# Patient Record
Sex: Female | Born: 1965
Health system: Southern US, Community
[De-identification: ages and names within clinical notes are randomized; demographics above are authoritative.]

## PROBLEM LIST (undated history)

## (undated) ENCOUNTER — Ambulatory Visit: Payer: 59 | Source: Home / Self Care

## (undated) DIAGNOSIS — I639 Cerebral infarction, unspecified: Secondary | ICD-10-CM

## (undated) DIAGNOSIS — Z789 Other specified health status: Secondary | ICD-10-CM

## (undated) DIAGNOSIS — I10 Essential (primary) hypertension: Secondary | ICD-10-CM

## (undated) HISTORY — DX: Other specified health status: Z78.9

## (undated) HISTORY — PX: ANKLE FRACTURE SURGERY: SHX122

---

## 1998-01-27 ENCOUNTER — Encounter: Admission: RE | Admit: 1998-01-27 | Discharge: 1998-01-27 | Payer: Self-pay | Admitting: Family Medicine

## 1998-02-01 ENCOUNTER — Other Ambulatory Visit: Admission: RE | Admit: 1998-02-01 | Discharge: 1998-02-01 | Payer: Self-pay | Admitting: *Deleted

## 1998-02-01 ENCOUNTER — Encounter: Admission: RE | Admit: 1998-02-01 | Discharge: 1998-02-01 | Payer: Self-pay | Admitting: Sports Medicine

## 1998-11-18 ENCOUNTER — Encounter: Admission: RE | Admit: 1998-11-18 | Discharge: 1998-11-18 | Payer: Self-pay | Admitting: Family Medicine

## 1999-11-02 ENCOUNTER — Encounter: Admission: RE | Admit: 1999-11-02 | Discharge: 1999-11-02 | Payer: Self-pay | Admitting: Family Medicine

## 1999-11-02 ENCOUNTER — Ambulatory Visit (HOSPITAL_COMMUNITY): Admission: RE | Admit: 1999-11-02 | Discharge: 1999-11-02 | Payer: Self-pay | Admitting: Family Medicine

## 1999-11-04 ENCOUNTER — Encounter: Payer: Self-pay | Admitting: Family Medicine

## 1999-11-04 ENCOUNTER — Encounter: Admission: RE | Admit: 1999-11-04 | Discharge: 1999-11-04 | Payer: Self-pay | Admitting: Family Medicine

## 1999-11-09 ENCOUNTER — Encounter: Admission: RE | Admit: 1999-11-09 | Discharge: 1999-11-09 | Payer: Self-pay | Admitting: Family Medicine

## 1999-11-09 ENCOUNTER — Encounter: Payer: Self-pay | Admitting: Family Medicine

## 2000-06-06 ENCOUNTER — Emergency Department (HOSPITAL_COMMUNITY): Admission: EM | Admit: 2000-06-06 | Discharge: 2000-06-06 | Payer: Self-pay | Admitting: Emergency Medicine

## 2004-06-22 ENCOUNTER — Emergency Department (HOSPITAL_COMMUNITY): Admission: EM | Admit: 2004-06-22 | Discharge: 2004-06-22 | Payer: Self-pay | Admitting: Emergency Medicine

## 2004-07-04 ENCOUNTER — Emergency Department (HOSPITAL_COMMUNITY): Admission: EM | Admit: 2004-07-04 | Discharge: 2004-07-04 | Payer: Self-pay | Admitting: Family Medicine

## 2004-07-07 ENCOUNTER — Emergency Department (HOSPITAL_COMMUNITY): Admission: EM | Admit: 2004-07-07 | Discharge: 2004-07-07 | Payer: Self-pay | Admitting: Family Medicine

## 2005-01-07 ENCOUNTER — Emergency Department (HOSPITAL_COMMUNITY): Admission: EM | Admit: 2005-01-07 | Discharge: 2005-01-08 | Payer: Self-pay | Admitting: Emergency Medicine

## 2005-01-07 IMAGING — CR DG ANKLE COMPLETE 3+V*L*
2 series · 2 of 2 positions shown · non-contrast
Comparison: none

CLINICAL DATA: Severe left ankle injury with pain and swelling.
 2-VIEW LEFT ANKLE:

[view not recorded (1 of 2)]
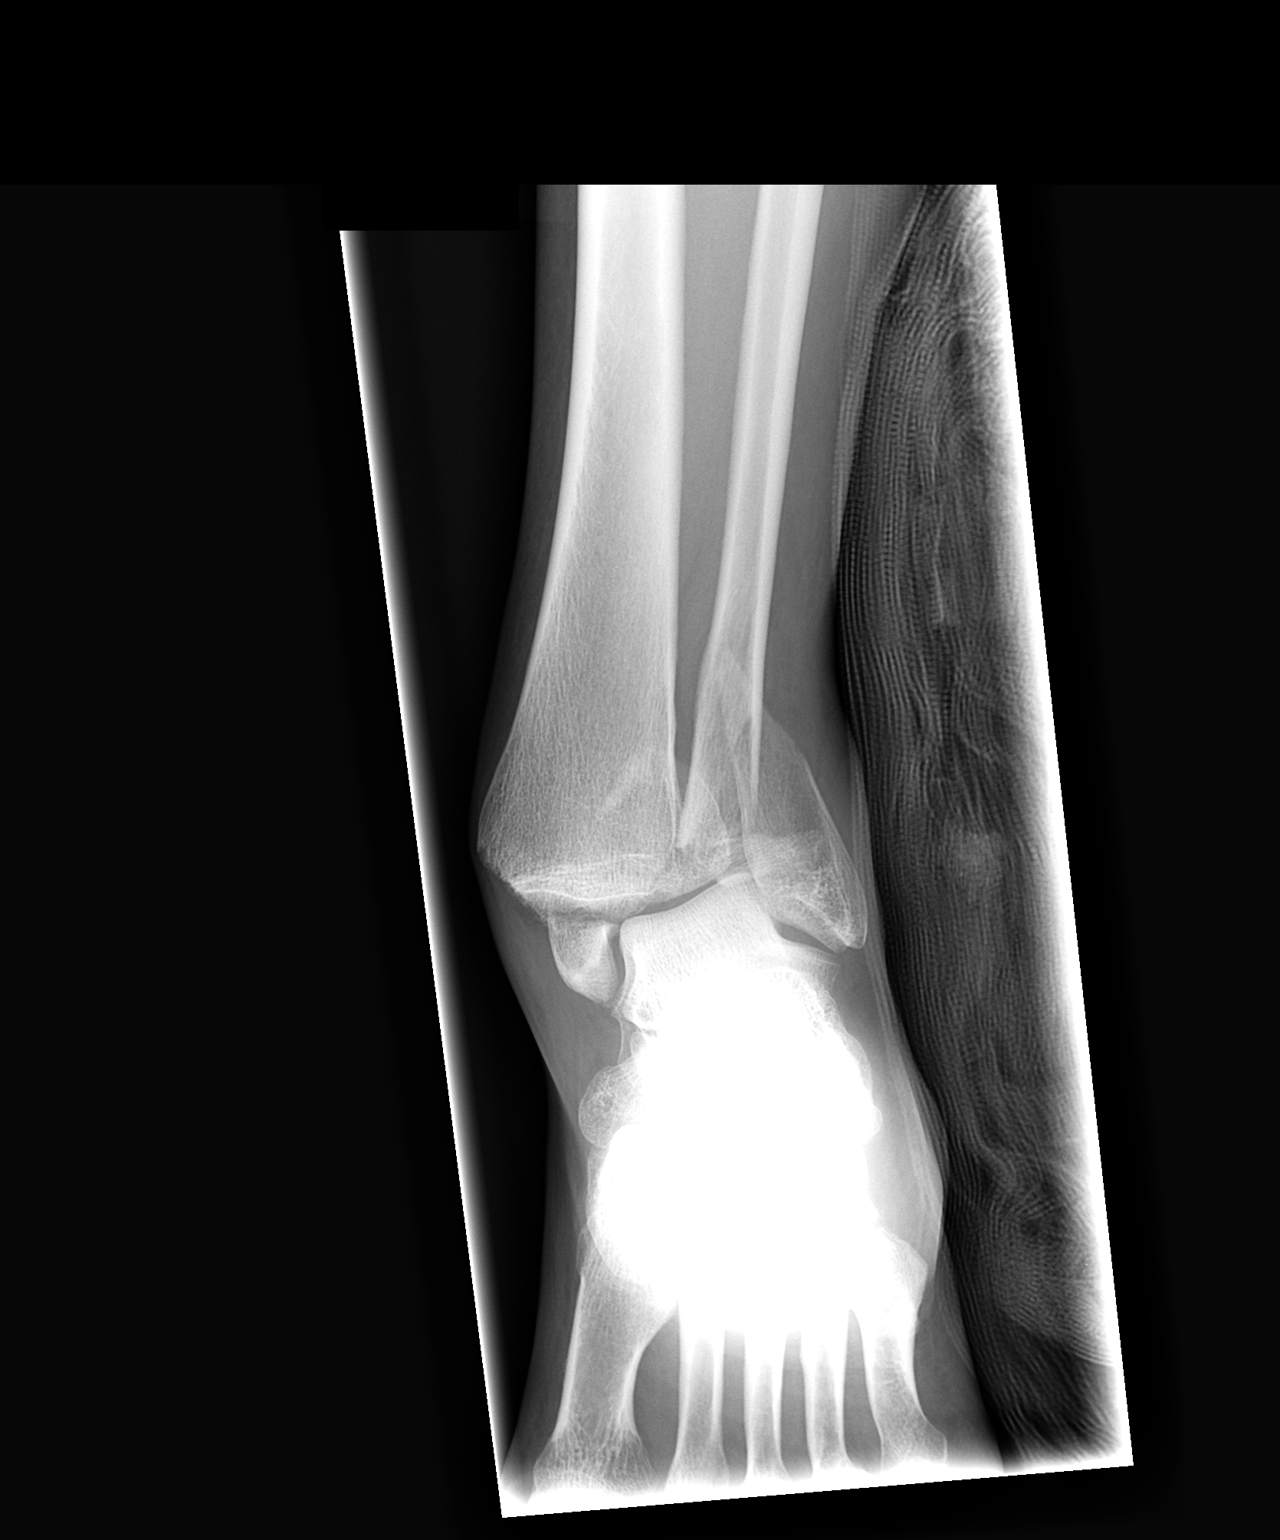

[view not recorded (2 of 2)]
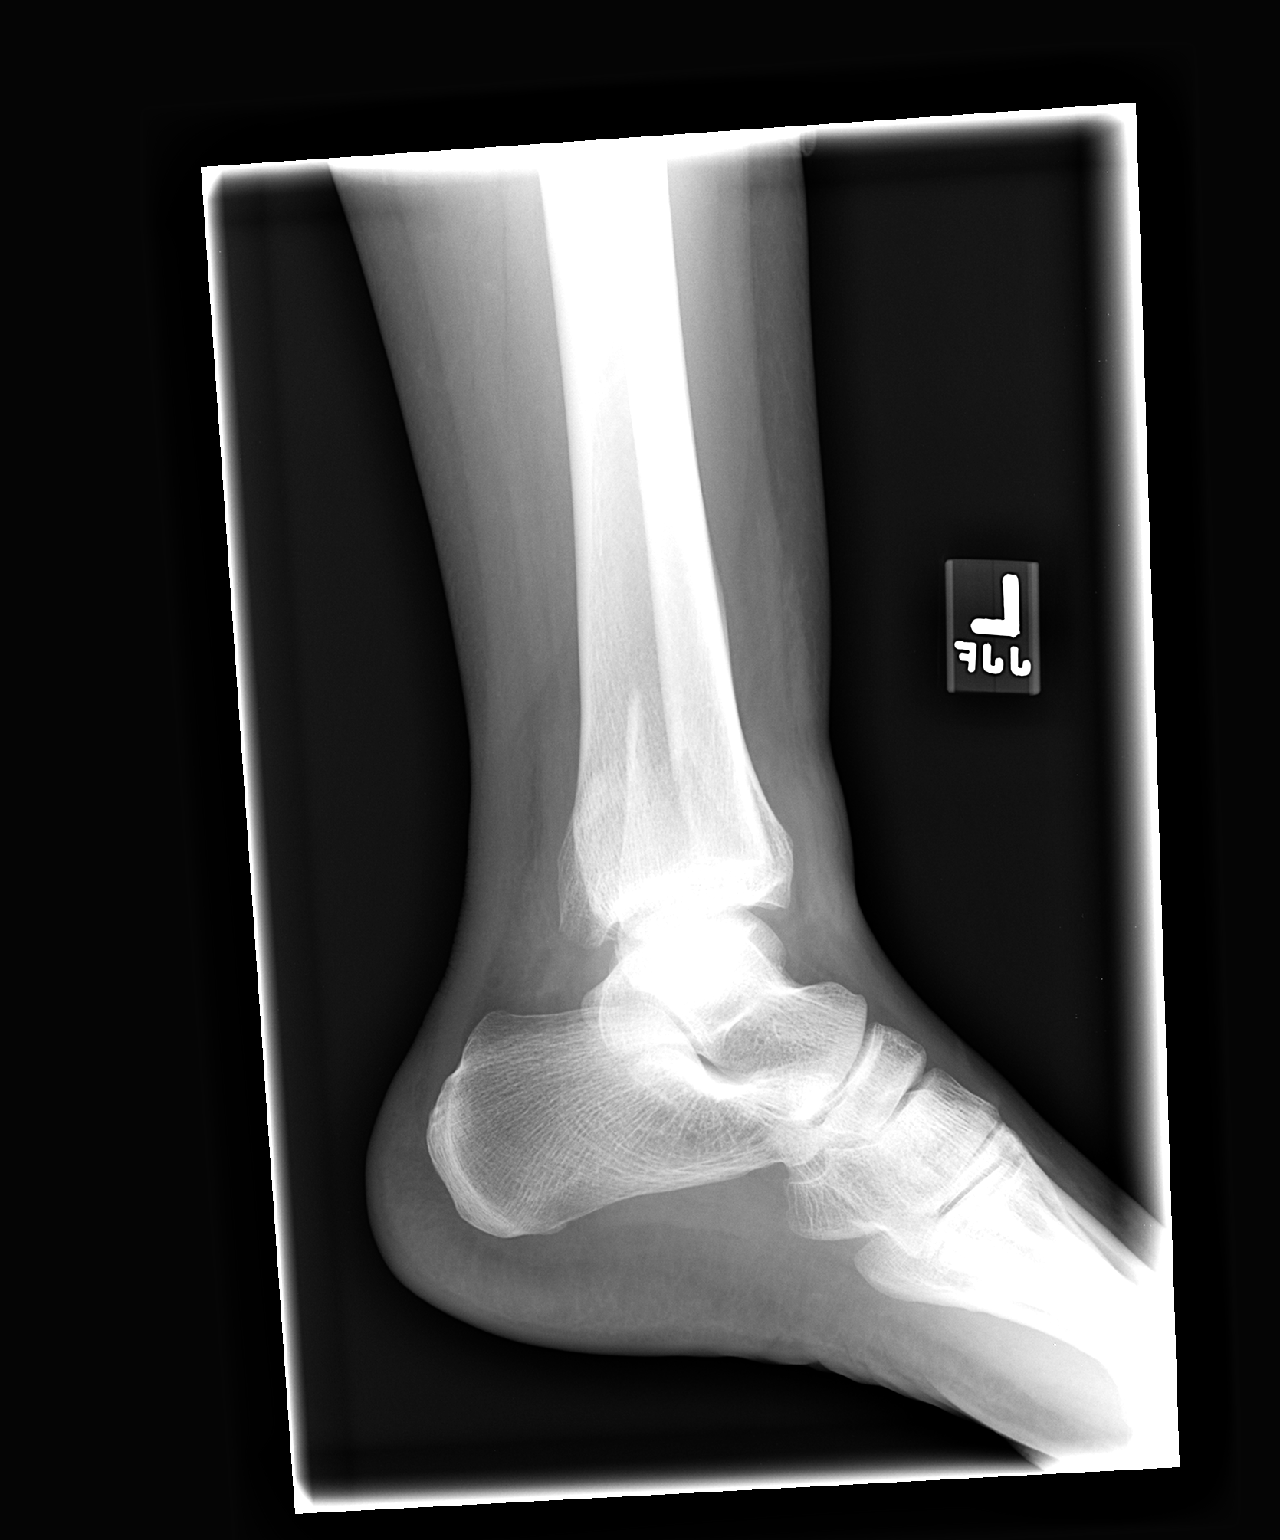

[2 of 2 positions shown; findings below may reference images not displayed]

FINDINGS: Trimalleolar fracture is identified with 1.3 cm lateral displacement.  There is widening of the anterior ankle mortise.
IMPRESSION: Trimalleolar fracture with 1.3 cm lateral displacement.

## 2005-01-09 ENCOUNTER — Ambulatory Visit (HOSPITAL_COMMUNITY): Admission: RE | Admit: 2005-01-09 | Discharge: 2005-01-10 | Payer: Self-pay | Admitting: Orthopedic Surgery

## 2005-03-06 ENCOUNTER — Emergency Department (HOSPITAL_COMMUNITY): Admission: EM | Admit: 2005-03-06 | Discharge: 2005-03-06 | Payer: Self-pay | Admitting: Family Medicine

## 2005-10-12 ENCOUNTER — Ambulatory Visit: Payer: Self-pay | Admitting: Family Medicine

## 2005-10-25 ENCOUNTER — Ambulatory Visit: Payer: Self-pay | Admitting: Sports Medicine

## 2007-07-07 ENCOUNTER — Emergency Department (HOSPITAL_COMMUNITY): Admission: EM | Admit: 2007-07-07 | Discharge: 2007-07-07 | Payer: Self-pay | Admitting: Family Medicine

## 2007-07-22 ENCOUNTER — Emergency Department (HOSPITAL_COMMUNITY): Admission: EM | Admit: 2007-07-22 | Discharge: 2007-07-22 | Payer: Self-pay | Admitting: Emergency Medicine

## 2007-08-02 ENCOUNTER — Emergency Department (HOSPITAL_COMMUNITY): Admission: EM | Admit: 2007-08-02 | Discharge: 2007-08-02 | Payer: Self-pay | Admitting: Family Medicine

## 2008-02-24 ENCOUNTER — Emergency Department (HOSPITAL_COMMUNITY): Admission: EM | Admit: 2008-02-24 | Discharge: 2008-02-24 | Payer: Self-pay | Admitting: Family Medicine

## 2009-08-01 ENCOUNTER — Emergency Department (HOSPITAL_COMMUNITY): Admission: EM | Admit: 2009-08-01 | Discharge: 2009-08-01 | Payer: Self-pay | Admitting: Emergency Medicine

## 2009-08-03 ENCOUNTER — Emergency Department (HOSPITAL_COMMUNITY): Admission: EM | Admit: 2009-08-03 | Discharge: 2009-08-03 | Payer: Self-pay | Admitting: Emergency Medicine

## 2009-08-10 ENCOUNTER — Emergency Department (HOSPITAL_COMMUNITY): Admission: EM | Admit: 2009-08-10 | Discharge: 2009-08-10 | Payer: Self-pay | Admitting: Emergency Medicine

## 2009-08-21 ENCOUNTER — Emergency Department (HOSPITAL_COMMUNITY): Admission: EM | Admit: 2009-08-21 | Discharge: 2009-08-21 | Payer: Self-pay | Admitting: Emergency Medicine

## 2010-05-30 LAB — POCT URINALYSIS DIP (DEVICE)
Bilirubin Urine: NEGATIVE
Glucose, UA: NEGATIVE mg/dL
Ketones, ur: NEGATIVE mg/dL
Nitrite: NEGATIVE
Protein, ur: 30 mg/dL — AB
Specific Gravity, Urine: 1.025 (ref 1.005–1.030)
Urobilinogen, UA: 0.2 mg/dL (ref 0.0–1.0)
pH: 6.5 (ref 5.0–8.0)

## 2010-05-30 LAB — HERPES SIMPLEX VIRUS CULTURE

## 2010-05-30 LAB — GC/CHLAMYDIA PROBE AMP, GENITAL
Chlamydia, DNA Probe: NEGATIVE
GC Probe Amp, Genital: NEGATIVE

## 2010-05-30 LAB — WET PREP, GENITAL: Yeast Wet Prep HPF POC: NONE SEEN

## 2010-05-30 LAB — POCT PREGNANCY, URINE: Preg Test, Ur: NEGATIVE

## 2010-05-30 LAB — HEMOCCULT GUIAC POC 1CARD (OFFICE): Fecal Occult Bld: NEGATIVE

## 2010-07-01 NOTE — Op Note (Signed)
[place], DOST          ACCOUNT NO.:  [id number]   MEDICAL RECORD NO.:  [id number]          PATIENT TYPE:  OIB   LOCATION:  5035                         FACILITY:  [facility]   PHYSICIAN:  Nadara Mustard, MD     DATE OF BIRTH:  Mar 15, 1965   DATE OF PROCEDURE:  01/09/2005  DATE OF DISCHARGE:  01/10/2005                                 OPERATIVE REPORT   PREOP DIAGNOSIS:  Fracture dislocation, bimalleolar left ankle fracture.   POSTOP DIAGNOSIS:  Fracture dislocation, bimalleolar left ankle fracture.   PROCEDURE:  Open reduction, internal fixation left ankle with fixation of  both the medial malleolus and lateral malleolus.   SURGEON:  Nadara Mustard, MD   ANESTHESIA:  General.   ESTIMATED BLOOD LOSS:  Minimal.   ANTIBIOTICS:  1 gram of Kefzol.   DRAINS:  None.   COMPLICATIONS:  None.   TOURNIQUET TIME:  None.   DISPOSITION:  To PACU in stable condition.   INDICATIONS FOR PROCEDURE:  The patient is a 45 year old woman who was  initially seen on Saturday in the emergency room. The patient had a  dislocated ankle fracture; and she was discharged to home from the emergency  room without orthopedic consultation. The patient arrived in my office,  today, on Monday with a persistent fracture dislocation. Examination of the  skin showed a fracture blister of the medial malleolus. The patient had a  persistent dislocation of the ankle. Due to the dislocation and fracture  pattern, the patient was brought urgently to the operating room for open  reduction internal fixation. The risks and benefits were discussed including  infection, neurovascular injury, nonhealing of the wounds, need for  additional surgery. The patient states that he understands and wishes to  proceed at this time.   DESCRIPTION OF PROCEDURE:  The patient was brought to OR room #4 and  underwent a general anesthetic. After adequate level of anesthesia obtained,  the patient's left lower extremity was  prepped using DuraPrep and draped  into a sterile field. A lateral incision was made over the fibula and this  was carried down to the fibula. The fracture site was freshened. The  anterior aspect of the distal fibula had flipped into the oblique fracture  site of the fibula. The three fragments were reduced; and the distal  fragment was lagged using a 3.5 cortical screw; and the oblique fracture was  also lagged using a 3.5 cortical screw. A neutralization plate was then  placed laterally with two screws distally and three screws proximally. The  wound was irrigated and C-arm fluoroscopy verified reduction and restoration  of the length of the fibula. The subcu was closed using 2-0 Vicryl. The skin  was closed using approximating staples.   Attention was then focused medially. The medial malleolar fragment was  reduced, irrigated, and stabilized with a 40 mm, long 207, 4.0 mm cancellus  screw. This stabilized the fracture well. The wounds were irrigated. C-arm  fluoroscopy verified reduction in both AP and lateral planes. The subcu was  closed using 2-0 Vicryl. The skin was closed using approximating staples.  The lateral  blister was not within the surgical incision but was anterior to  it. The wounds were covered with Adaptic, orthopedic sponges, sterile  Webril, and a Coban dressing. The patient was extubated, and taken to PACU  in stable condition.      Nadara Mustard, MD  Electronically Signed     MVD/MEDQ  D:  01/09/2005  T:  01/10/2005  Job:  [id number]

## 2011-08-30 ENCOUNTER — Encounter (HOSPITAL_COMMUNITY): Payer: Self-pay | Admitting: *Deleted

## 2011-08-30 ENCOUNTER — Emergency Department (HOSPITAL_COMMUNITY): Payer: Self-pay

## 2011-08-30 ENCOUNTER — Emergency Department (HOSPITAL_COMMUNITY)
Admission: EM | Admit: 2011-08-30 | Discharge: 2011-08-31 | Disposition: A | Discharge status: Home / Self Care | Payer: Self-pay | Attending: Emergency Medicine | Admitting: Emergency Medicine

## 2011-08-30 DIAGNOSIS — Y9301 Activity, walking, marching and hiking: Secondary | ICD-10-CM | POA: Insufficient documentation

## 2011-08-30 DIAGNOSIS — S93609A Unspecified sprain of unspecified foot, initial encounter: Secondary | ICD-10-CM

## 2011-08-30 DIAGNOSIS — X500XXA Overexertion from strenuous movement or load, initial encounter: Secondary | ICD-10-CM | POA: Insufficient documentation

## 2011-08-30 IMAGING — CR DG FOOT COMPLETE 3+V*L*
3 series · 3 of 3 positions shown · non-contrast
Comparison: [DATE]

CLINICAL DATA: Pain after twisting injury

LEFT FOOT - COMPLETE 3+ VIEW

[t foot ap left]
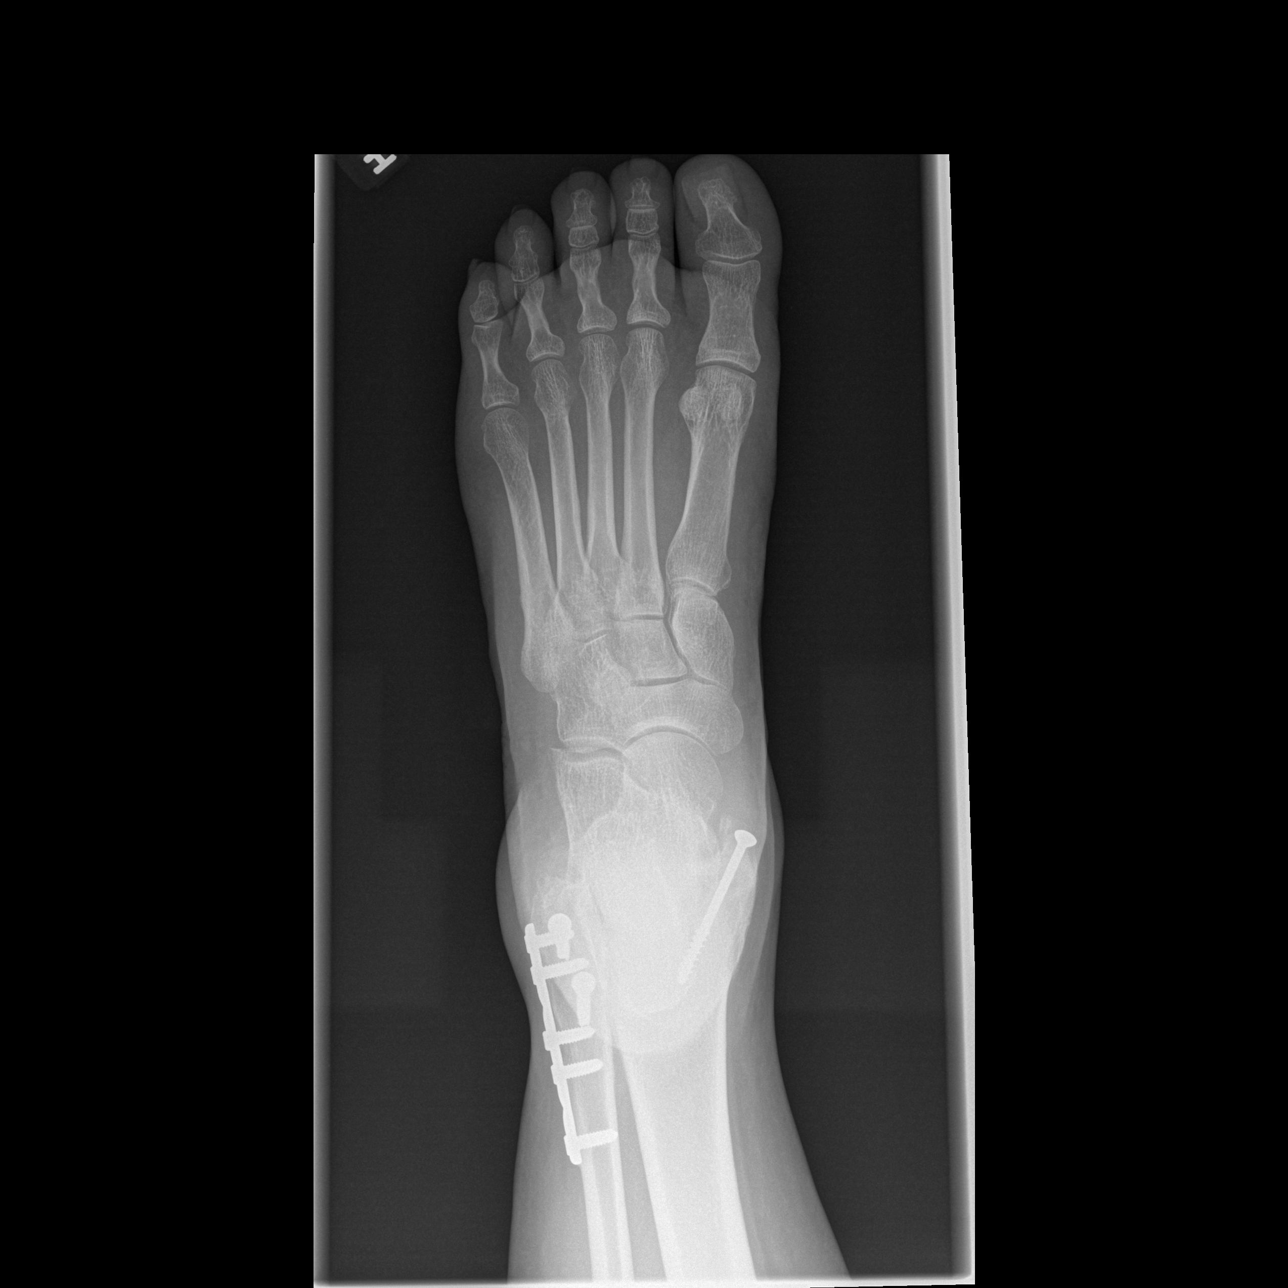

[t foot oblique left]
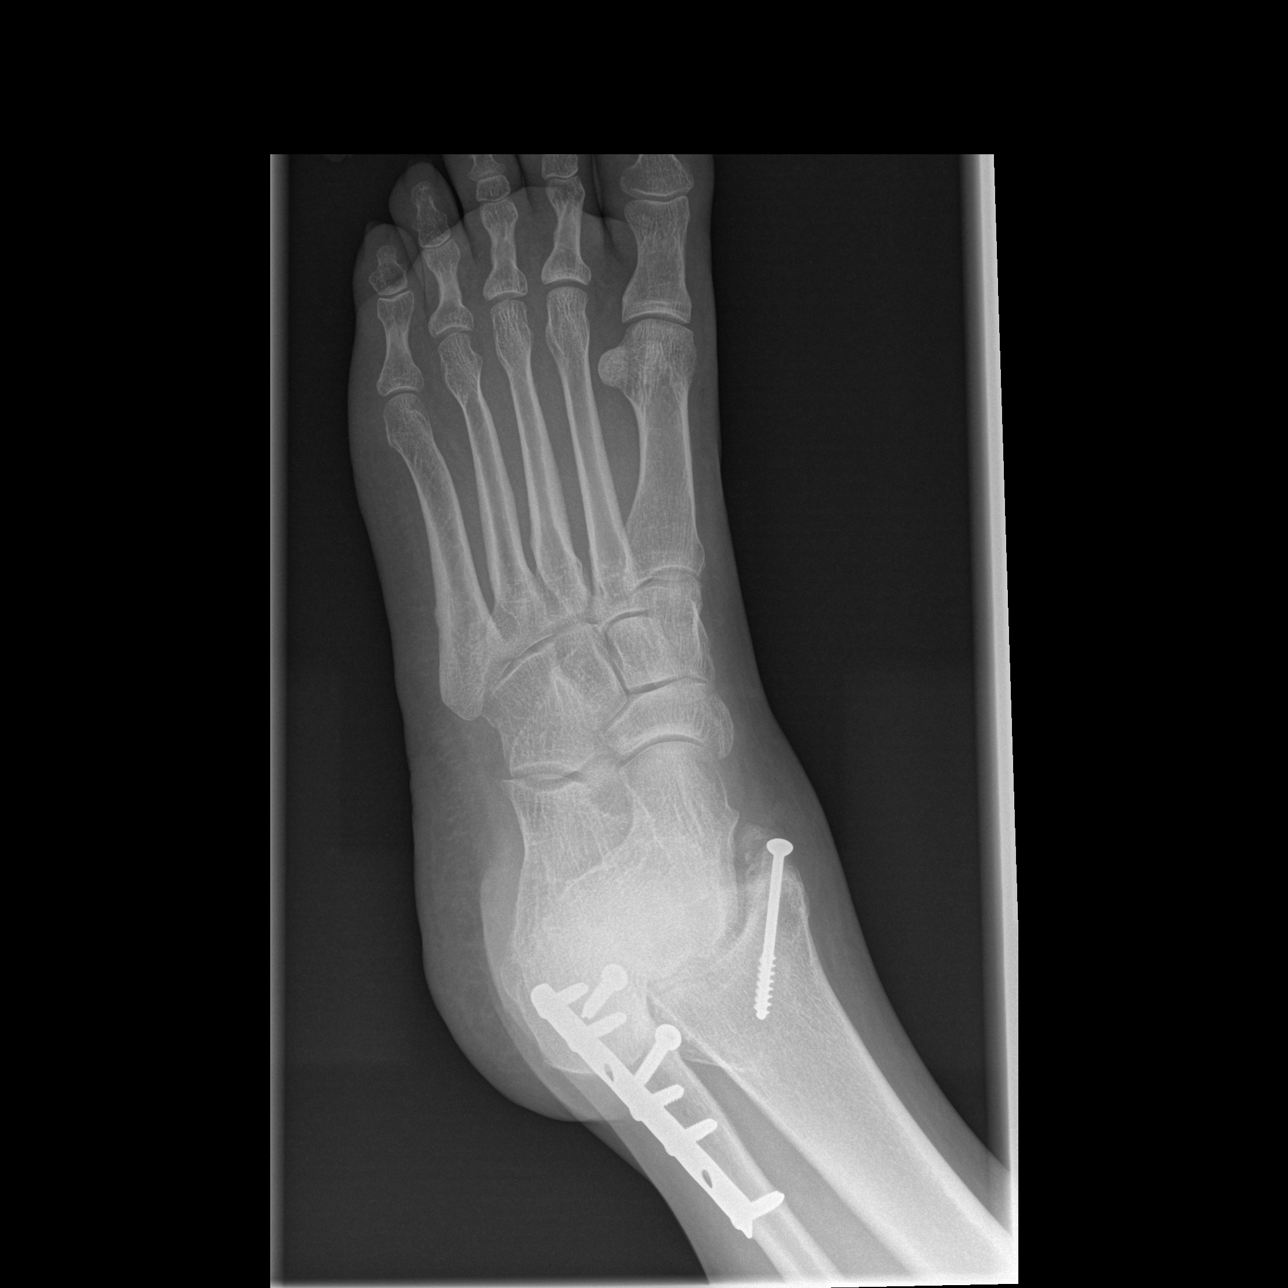

[t foot lat left]
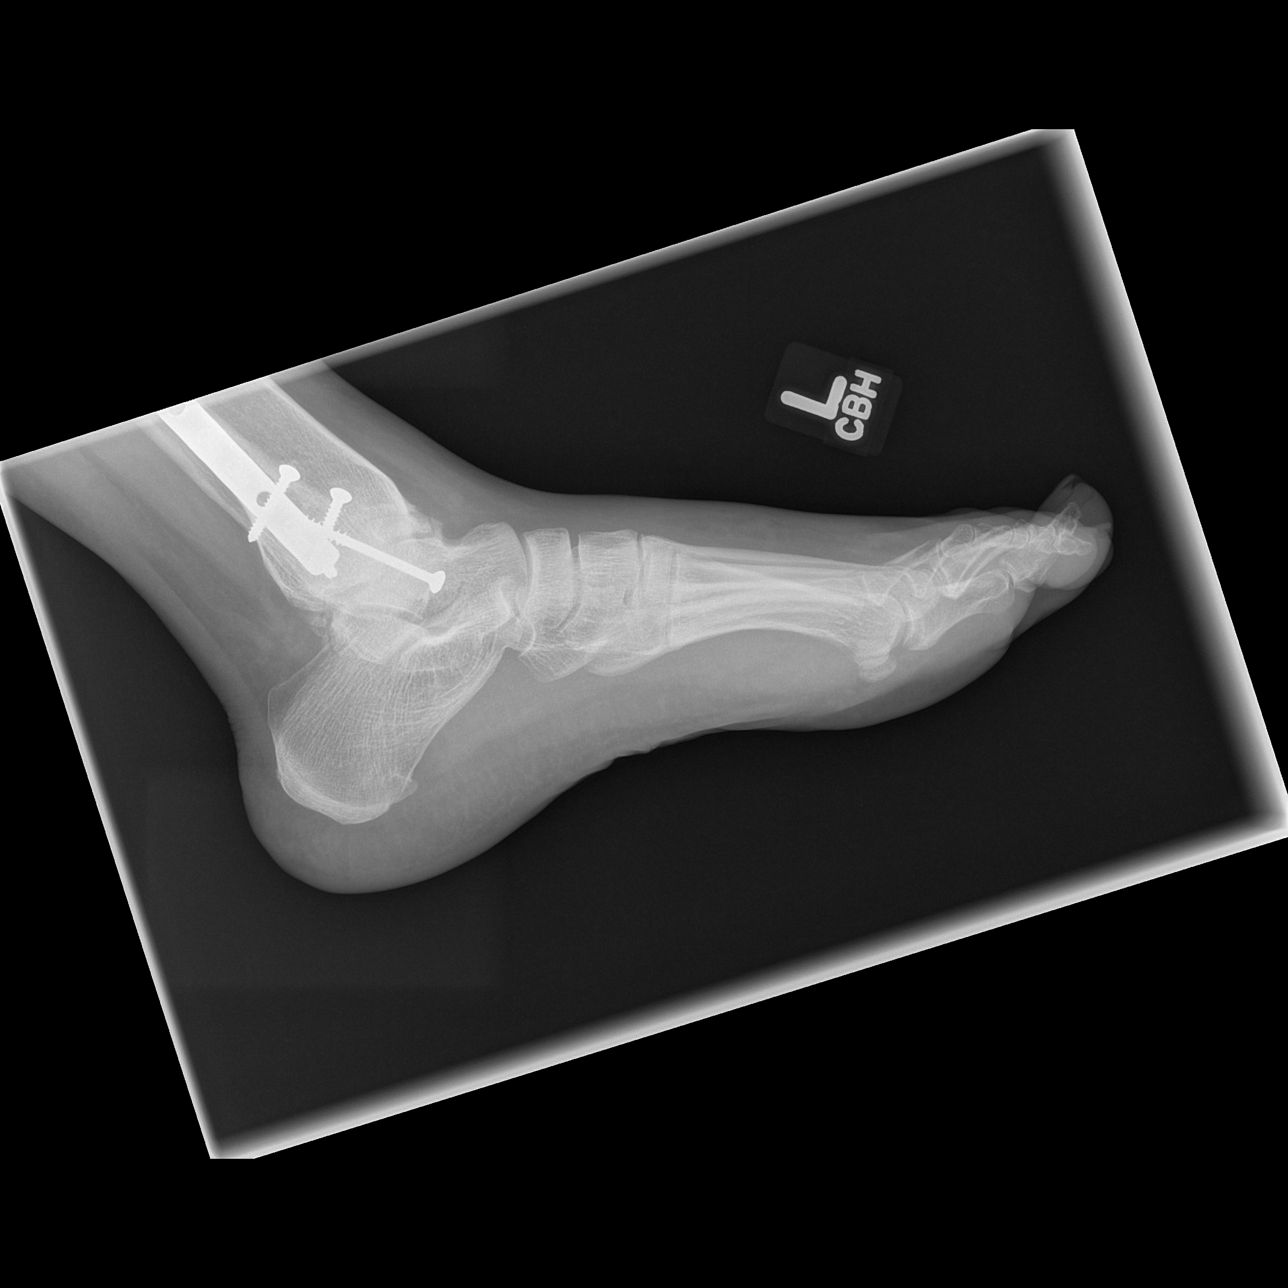

[3 of 3 positions shown; findings below may reference images not displayed]

FINDINGS: There has been interval plate and screw fixation of the
distal fibula and screw fixation of the medial malleolus, hardware
intact without surrounding lucency.  There are degenerative changes
at the tibiotalar articulation.  Negative for fracture or other
acute bony abnormality.  Normal mineralization and alignment.
IMPRESSION: 1.  Negative for fracture or other acute abnormality
2.  Postoperative and degenerative changes at the ankle.

## 2011-08-30 NOTE — ED Notes (Signed)
Patient twisted her foot about a week ago and it started hurting four days ago.  She noticed it swelling and the swelling is not going down.  Patient thinks that she heard a pop when she twisted her foot.  Patient is able to ambulate

## 2011-08-31 NOTE — ED Provider Notes (Signed)
History     CSN: [id number]  Arrival date & time 08/30/11  2118   None     Chief Complaint  Patient presents with  . Foot Swelling    (Consider location/radiation/quality/duration/timing/severity/associated sxs/prior treatment) HPI Comments: Patient state that a week ago.  She was walking and twisted her foot.  Initially, she had no pain, but a day or 2 later, she noticed some swelling in the midfoot area.  That has been persistent.  She does note, that if she elevates her foot and rested and wears a supportive shoe.  She has less discomfort, and swelling .  She, states she's been soaking her foot in Epson salts  The history is provided by the patient.    History reviewed. No pertinent past medical history.  History reviewed. No pertinent past surgical history.  No family history on file.  History  Substance Use Topics  . Smoking status: Never Smoker   . Smokeless tobacco: Not on file  . Alcohol Use: No    OB History    Grav Para Term Preterm Abortions TAB SAB Ect Mult Living                  Review of Systems  Constitutional: Negative for fever.  Musculoskeletal: Positive for gait problem. Negative for joint swelling.  Skin: Negative for wound.  Neurological: Negative for dizziness, weakness and numbness.    Allergies  Review of patient's allergies indicates no known allergies.  Home Medications  No current outpatient prescriptions on file.  BP 140/79  Pulse 100  Temp 98.1 F (36.7 C) (Oral)  Resp 16  SpO2 98%  LMP 08/07/2011  Physical Exam  Constitutional: She appears well-developed and well-nourished.  HENT:  Head: Normocephalic.  Eyes: Pupils are equal, round, and reactive to light.  Neck: Normal range of motion.  Cardiovascular: Normal rate.   Pulmonary/Chest: Effort normal.  Musculoskeletal: Normal range of motion.       Feet:  Neurological: She is alert.  Skin: Skin is warm. No erythema.    ED Course  Procedures (including critical  care time)  Labs Reviewed - No data to display Dg Foot Complete Left  08/30/2011  *RADIOLOGY REPORT*  Clinical Data: Pain after twisting injury  LEFT FOOT - COMPLETE 3+ VIEW  Comparison: 01/07/2005  Findings: There has been interval plate and screw fixation of the distal fibula and screw fixation of the medial malleolus, hardware intact without surrounding lucency.  There are degenerative changes at the tibiotalar articulation.  Negative for fracture or other acute bony abnormality.  Normal mineralization and alignment.  IMPRESSION:  1.  Negative for fracture or other acute abnormality 2.  Postoperative and degenerative changes at the ankle.  Original Report Authenticated By: Michelle Yang, M.D.     1. Foot sprain       MDM   Michelle Yang of x-ray reveals no fracture.  Patient's exam shows that she has some mild swelling at the base of the third, fourth, and fifth toe on her left foot.  It is mildly painful with deep palpation.  There is no pain with movement of the toes or ankle.  There is no erythema or breaks in the skin or discoloration.  Patient will be placed in an Ace bandage, and a postoperative boot with instructions to keep her foot elevated as much as possible over the next 3-4, days, with, regular doses of ibuprofen for pain and swelling        Cipriano Mile  Manus Rudd, NP 08/31/11 0018  Arman Filter, NP 08/31/11 [phone]

## 2011-09-01 NOTE — ED Provider Notes (Signed)
Medical screening examination/treatment/procedure(s) were performed by non-physician practitioner and as supervising physician I was immediately available for consultation/collaboration.   Zareen Jamison, MD 09/01/11 0724 

## 2012-08-17 ENCOUNTER — Encounter (HOSPITAL_COMMUNITY): Payer: Self-pay

## 2012-08-17 ENCOUNTER — Emergency Department (HOSPITAL_COMMUNITY)
Admission: EM | Admit: 2012-08-17 | Discharge: 2012-08-17 | Disposition: A | Discharge status: Home / Self Care | Payer: Self-pay | Attending: Emergency Medicine | Admitting: Emergency Medicine

## 2012-08-17 DIAGNOSIS — L237 Allergic contact dermatitis due to plants, except food: Secondary | ICD-10-CM

## 2012-08-17 DIAGNOSIS — L255 Unspecified contact dermatitis due to plants, except food: Secondary | ICD-10-CM | POA: Insufficient documentation

## 2012-08-17 DIAGNOSIS — R21 Rash and other nonspecific skin eruption: Secondary | ICD-10-CM | POA: Insufficient documentation

## 2012-08-17 MED ORDER — TRIAMCINOLONE ACETONIDE 0.5 % EX OINT: TOPICAL_OINTMENT | Freq: Two times a day (BID) | CUTANEOUS | Status: DC

## 2012-08-17 MED ORDER — HYDROXYZINE HCL 10 MG PO TABS
10.0000 mg | ORAL_TABLET | Freq: Once | ORAL | Status: AC
  Administered 2012-08-17: 10 mg via ORAL
  Filled 2012-08-17: qty 1

## 2012-08-17 MED ORDER — PREDNISONE 20 MG PO TABS: ORAL_TABLET | ORAL | Status: DC

## 2012-08-17 MED ORDER — HYDROXYZINE HCL 25 MG PO TABS: 25.0000 mg | ORAL_TABLET | Freq: Four times a day (QID) | ORAL | Status: DC

## 2012-08-17 NOTE — ED Provider Notes (Signed)
Medical screening examination/treatment/procedure(s) were performed by non-physician practitioner and as supervising physician I was immediately available for consultation/collaboration.   Kavish Lafitte, MD 08/17/12 2315 

## 2012-08-17 NOTE — ED Provider Notes (Signed)
   History    This chart was scribed for Arthor Captain, non-physician practitioner working with Dione Booze, MD by Leone Payor, ED Scribe. This patient was seen in room TR10C/TR10C and the patient's care was started at 1723.  CSN: [id number] Arrival date & time 08/17/12  1723  None    Chief Complaint  Patient presents with  . Poison Oak    The history is provided by the patient. No language interpreter was used.    HPI Comments: Michelle Yang is a 47 y.o. female who presents to the Emergency Department complaining of constant, gradually worsening poison oak starting 3 days ago. The rash is located to bilateral upper extremities, abdomen, and breasts. States she cut down a bush that she did not recognize as poison oak/ivy. She denies trying any treatments at home.   History reviewed. No pertinent past medical history. History reviewed. No pertinent past surgical history. History reviewed. No pertinent family history. History  Substance Use Topics  . Smoking status: Never Smoker   . Smokeless tobacco: Not on file  . Alcohol Use: No   OB History   Grav Para Term Preterm Abortions TAB SAB Ect Mult Living                 Review of Systems  Constitutional: Negative for fever and chills.  HENT: Negative for facial swelling.   Skin: Positive for rash.    Allergies  Review of patient's allergies indicates no known allergies.  Home Medications  No current outpatient prescriptions on file. There were no vitals taken for this visit. Physical Exam  Nursing note and vitals reviewed. Constitutional: She is oriented to person, place, and time. She appears well-developed and well-nourished. No distress.  HENT:  Head: Normocephalic and atraumatic.  Eyes: EOM are normal.  Neck: Neck supple. No tracheal deviation present.  Cardiovascular: Normal rate, regular rhythm and normal heart sounds.   Pulmonary/Chest: Effort normal and breath sounds normal. No respiratory distress. She  has no wheezes. She has no rales. She exhibits no tenderness.  Abdominal: Soft. There is no tenderness.  Musculoskeletal: Normal range of motion.  Neurological: She is alert and oriented to person, place, and time.  Skin: Skin is warm and dry. Rash noted. Rash is vesicular.  Vesicular eruption and serous weeping. Signs of excoriation along with linear vesicular patterns on bilateral arms. Multiple confluent vesicles. Areas are hot and red but no signs of infections. Also noted to breasts, abdomen and abdominal fold.   Psychiatric: She has a normal mood and affect. Her behavior is normal.    ED Course  Procedures (including critical care time)  DIAGNOSTIC STUDIES: Oxygen Saturation is 98% on RA, normal by my interpretation.    COORDINATION OF CARE: 5:39 PM Discussed treatment plan with pt at bedside and pt agreed to plan.   Labs Reviewed - No data to display No results found. 1. Poison ivy dermatitis     MDM  Patient with contact dermatitis. Sig confluent viesicles and weeping. I am discharging the patient with kenlog ointment. 21 day steroid taper and atarax. Domeboro soaks for comfort I personally performed the services described in this documentation, which was scribed in my presence. The recorded information has been reviewed and is accurate.    Arthor Captain, PA-C 08/17/12 1810

## 2012-08-17 NOTE — ED Notes (Signed)
Pt reports poison oak 3 days ago, denies at home treatment

## 2012-08-17 NOTE — ED Notes (Signed)
PA at bedside.

## 2014-01-28 ENCOUNTER — Encounter (HOSPITAL_COMMUNITY): Payer: Self-pay | Admitting: Emergency Medicine

## 2014-01-28 ENCOUNTER — Emergency Department (HOSPITAL_COMMUNITY)
Admission: EM | Admit: 2014-01-28 | Discharge: 2014-01-28 | Disposition: A | Discharge status: Home / Self Care | Payer: Self-pay | Attending: Emergency Medicine | Admitting: Emergency Medicine

## 2014-01-28 DIAGNOSIS — Z7952 Long term (current) use of systemic steroids: Secondary | ICD-10-CM | POA: Insufficient documentation

## 2014-01-28 DIAGNOSIS — R21 Rash and other nonspecific skin eruption: Secondary | ICD-10-CM | POA: Insufficient documentation

## 2014-01-28 DIAGNOSIS — Z79899 Other long term (current) drug therapy: Secondary | ICD-10-CM | POA: Insufficient documentation

## 2014-01-28 DIAGNOSIS — B09 Unspecified viral infection characterized by skin and mucous membrane lesions: Secondary | ICD-10-CM

## 2014-01-28 MED ORDER — OXYMETAZOLINE HCL 0.05 % NA SOLN
1.0000 | Freq: Two times a day (BID) | NASAL | Status: AC
  Administered 2014-01-28: 1 via NASAL
  Filled 2014-01-28: qty 15

## 2014-01-28 MED ORDER — DIPHENHYDRAMINE HCL 25 MG PO CAPS
50.0000 mg | ORAL_CAPSULE | Freq: Once | ORAL | Status: AC
  Administered 2014-01-28: 50 mg via ORAL
  Filled 2014-01-28: qty 2

## 2014-01-28 NOTE — Discharge Instructions (Signed)

## 2014-01-28 NOTE — ED Provider Notes (Signed)
CSN: [id number]     Arrival date & time 01/28/14  0719 History   First MD Initiated Contact with Patient 01/28/14 [phone]     Chief Complaint  Patient presents with  . Rash     (Consider location/radiation/quality/duration/timing/severity/associated sxs/prior Treatment) HPI Comments: Patient with no pertinent past medical history presents to the emergency department with chief complaint of hives. She states that she has had cold-like symptoms for the past 4 days. She states that yesterday she noticed a maculopapular rash. She states that the rash is been persistent until today. She has not tried taking anything to alleviate her symptoms. She denies any throat tightening, difficulty breathing, vomiting, or diarrhea. She denies any new exposures to new foods or chemical irritants.  The history is provided by the patient. No language interpreter was used.    History reviewed. No pertinent past medical history. Past Surgical History  Procedure Laterality Date  . Ankle fracture surgery Left    History reviewed. No pertinent family history. History  Substance Use Topics  . Smoking status: Never Smoker   . Smokeless tobacco: Not on file  . Alcohol Use: No   OB History    No data available     Review of Systems  Constitutional: Negative for fever and chills.  Respiratory: Negative for shortness of breath.   Cardiovascular: Negative for chest pain.  Gastrointestinal: Negative for nausea, vomiting, diarrhea and constipation.  Genitourinary: Negative for dysuria.  Skin: Positive for rash.  All other systems reviewed and are negative.     Allergies  Review of patient's allergies indicates no known allergies.  Home Medications   Prior to Admission medications   Medication Sig Start Date End Date Taking? Authorizing Provider  hydrOXYzine (ATARAX/VISTARIL) 25 MG tablet Take 1 tablet (25 mg total) by mouth every 6 (six) hours. 08/17/12   Arthor CaptainAbigail Harris, PA-C  predniSONE (DELTASONE) 20  MG tablet Take 3 tablets a day for the 1st 7 days Take 2 tablets a day for the next 7 days. Take 1 tablet a day for 3 days. Take 1/2 tablet daily for the last 4 days. 08/17/12   Arthor CaptainAbigail Harris, PA-C  triamcinolone ointment (KENALOG) 0.5 % Apply topically 2 (two) times daily. 08/17/12   Abigail Harris, PA-C   BP 143/86 mmHg  Pulse 104  Temp(Src) 98.3 F (36.8 C) (Oral)  Resp 16  Ht 5\' 4"  (1.626 m)  Wt 185 lb (83.915 kg)  BMI 31.74 kg/m2  SpO2 100%  LMP 12/14/2013 Physical Exam  Constitutional: She is oriented to person, place, and time. She appears well-developed and well-nourished.  HENT:  Head: Normocephalic and atraumatic.  Oropharynx is clear, airway intact, no oral swelling  Eyes: Conjunctivae and EOM are normal. Pupils are equal, round, and reactive to light.  Neck: Normal range of motion. Neck supple.  Cardiovascular: Normal rate and regular rhythm.  Exam reveals no gallop and no friction rub.   No murmur heard. Pulmonary/Chest: Effort normal and breath sounds normal. No respiratory distress. She has no wheezes. She has no rales. She exhibits no tenderness.  Clear to auscultation bilaterally  Abdominal: Soft. She exhibits no distension and no mass. There is no tenderness. There is no rebound and no guarding.  Musculoskeletal: Normal range of motion. She exhibits no edema or tenderness.  Neurological: She is alert and oriented to person, place, and time.  Skin: Skin is warm and dry.  Maculopapular rash to extremities and trunk  Psychiatric: She has a normal mood and affect. Her  behavior is normal. Judgment and thought content normal.  Nursing note and vitals reviewed.   ED Course  Procedures (including critical care time) Labs Review Labs Reviewed - No data to display  Imaging Review No results found.   EKG Interpretation None      MDM   Final diagnoses:  Viral rash    Patient with probable viral rash, no new contact exposures, patient is afebrile. Will treat  with Benadryl. No sign of anaphylaxis. Will reassess.  Patient feels improved.  Recommend continued benadryl.  Shared visit with Dr. Hyacinth MeekerMiller.    Roxy Horsemanobert Savvy Peeters, PA-C 01/28/14 [phone]  Vida RollerBrian D Miller, MD 01/29/14 [phone]

## 2014-01-28 NOTE — ED Provider Notes (Signed)
The patient is a 48 year old female, she has had upper respiratory symptoms including nasal congestion, stuffiness and a mild sore throat with postnasal drip for approximately 4 days, she has been using TheraFlu which she has used routinely in the past without symptoms, she presents because of an itching rash which started overnight, this is persistent, found on her body on both her legs, arms, face, neck, trunk. She denies any oral lesions or swelling, denies any fevers or chills, denies nausea or vomiting. On exam she has a fine papular rash over her extremities, this is pruritic but there is no signs of urticaria petechiae or purpura. Her nasal passages are clear, oropharynx is clear and moist, no tenderness over the sinuses, vital signs as below, suggest symptomatically treatment for what appears to be a viral illness, the papular rash is most likely an exanthem related to that infection, symptomatically treat with antihistamines, patient given warning and precautions on using antihistamines and sedation.  Benadryl, aspirin, home with antihistamine as well.  Filed Vitals:   01/28/14 0729  BP: 143/86  Pulse: 104  Temp: 98.3 F (36.8 C)  Resp: 16   Medical screening examination/treatment/procedure(s) were conducted as a shared visit with non-physician practitioner(s) and myself.  I personally evaluated the patient during the encounter.  Clinical Impression:   Final diagnoses:  Viral rash          Vida RollerBrian D Abubakar Crispo, MD 01/29/14 [phone]

## 2014-01-28 NOTE — ED Notes (Signed)
Pt reports generalized red, red, raised macular rash since yesterday, cold symptoms x 4 days.  Denies difficulty in breathing or fever

## 2014-01-29 ENCOUNTER — Emergency Department (HOSPITAL_COMMUNITY)
Admission: EM | Admit: 2014-01-29 | Discharge: 2014-01-29 | Disposition: A | Discharge status: Home / Self Care | Payer: Self-pay | Attending: Emergency Medicine | Admitting: Emergency Medicine

## 2014-01-29 ENCOUNTER — Encounter (HOSPITAL_COMMUNITY): Payer: Self-pay | Admitting: Emergency Medicine

## 2014-01-29 DIAGNOSIS — B09 Unspecified viral infection characterized by skin and mucous membrane lesions: Secondary | ICD-10-CM | POA: Insufficient documentation

## 2014-01-29 MED ORDER — HYDROXYZINE HCL 25 MG PO TABS: 25.0000 mg | ORAL_TABLET | Freq: Four times a day (QID) | ORAL | Status: DC

## 2014-01-29 MED ORDER — TRIAMCINOLONE 0.1 % CREAM:EUCERIN CREAM 1:1: 1.0000 "application " | TOPICAL_CREAM | Freq: Two times a day (BID) | CUTANEOUS | Status: DC

## 2014-01-29 MED ORDER — DEXAMETHASONE SODIUM PHOSPHATE 10 MG/ML IJ SOLN
10.0000 mg | Freq: Once | INTRAMUSCULAR | Status: AC
  Administered 2014-01-29: 10 mg via INTRAMUSCULAR
  Filled 2014-01-29: qty 1

## 2014-01-29 MED ORDER — PREDNISONE 20 MG PO TABS: ORAL_TABLET | ORAL | Status: DC

## 2014-01-29 MED ORDER — TRIAMCINOLONE ACETONIDE 0.5 % EX OINT
TOPICAL_OINTMENT | Freq: Two times a day (BID) | CUTANEOUS | Status: DC
Start: 2014-01-29 — End: 2018-02-14

## 2014-01-29 NOTE — ED Provider Notes (Signed)
CSN: [id number]     Arrival date & time 01/29/14  1630 History   This chart was scribed for Arthor CaptainAbigail Dillion Stowers, PA-C working withChristopher Nedra HaiJ. Pollina, * by Evon Slackerrance Branch, ED Scribe. This patient was seen in room TR10C/TR10C and the patient's care was started at 5:07 PM.     Chief Complaint  Patient presents with  . Rash   Patient is a 48 y.o. female presenting with rash. The history is provided by the patient. No language interpreter was used.  Rash Associated symptoms: no diarrhea, no fever, no nausea, no shortness of breath and not vomiting    HPI Comments: Michelle Yang is a 48 y.o. female who presents to the Emergency Department complaining of progressively worsening rash onset 2 days ago. Pt states that the rash continue to spread. She denies being in contact with anyone with similar rash. Pt states that the rash is itchy and burns. She states that she has had cold-like symptoms for the past 5 days. She has tried taking the benadryl prescribed yesterday to alleviate her symptoms that hasn't provided any releif. She denies any throat tightening, difficulty breathing, vomiting, or diarrhea. She denies any new exposures to new foods or chemical irritants.     History reviewed. No pertinent past medical history. Past Surgical History  Procedure Laterality Date  . Ankle fracture surgery Left    No family history on file. History  Substance Use Topics  . Smoking status: Never Smoker   . Smokeless tobacco: Not on file  . Alcohol Use: No   OB History    No data available     Review of Systems  Constitutional: Negative for fever and chills.  Respiratory: Negative for shortness of breath.   Cardiovascular: Negative for chest pain.  Gastrointestinal: Negative for nausea, vomiting and diarrhea.  Skin: Positive for rash.    Allergies  Review of patient's allergies indicates no known allergies.  Home Medications   Prior to Admission medications   Medication Sig Start Date  End Date Taking? Authorizing Provider  hydrOXYzine (ATARAX/VISTARIL) 25 MG tablet Take 1 tablet (25 mg total) by mouth every 6 (six) hours. Patient not taking: Reported on 01/28/2014 08/17/12   Arthor CaptainAbigail Othon Guardia, PA-C  Pheniramine-PE-APAP (THERAFLU FLU & SORE THROAT) 20-10-650 MG PACK Take 30 mLs by mouth every 8 (eight) hours as needed (flu).    Historical Provider, MD  predniSONE (DELTASONE) 20 MG tablet Take 3 tablets a day for the 1st 7 days Take 2 tablets a day for the next 7 days. Take 1 tablet a day for 3 days. Take 1/2 tablet daily for the last 4 days. Patient not taking: Reported on 01/28/2014 08/17/12   Arthor CaptainAbigail Benzion Mesta, PA-C  triamcinolone ointment (KENALOG) 0.5 % Apply topically 2 (two) times daily. Patient not taking: Reported on 01/28/2014 08/17/12   Arthor CaptainAbigail Elimelech Houseman, PA-C   Triage Vitals: BP 155/93 mmHg  Pulse 99  Temp(Src) 98 F (36.7 C) (Oral)  Resp 18  Ht 5\' 4"  (1.626 m)  Wt 189 lb (85.73 kg)  BMI 32.43 kg/m2  SpO2 99%  LMP 01/29/2014  Physical Exam  Constitutional: She is oriented to person, place, and time. She appears well-developed and well-nourished. No distress.  HENT:  Head: Normocephalic and atraumatic.  Eyes: Conjunctivae and EOM are normal.  Neck: Neck supple. No tracheal deviation present.  Cardiovascular: Normal rate.   Pulmonary/Chest: Effort normal. No respiratory distress.  Musculoskeletal: Normal range of motion.  Neurological: She is alert and oriented to person, place, and  time.  Skin: Skin is warm and dry. Rash noted. Rash is maculopapular.    coarse maculopapular rash located on extremities, chest, back and face, mild swelling, excoriation, and areas of confluence.  Psychiatric: She has a normal mood and affect. Her behavior is normal.  Nursing note and vitals reviewed.   ED Course  Procedures (including critical care time) DIAGNOSTIC STUDIES: Oxygen Saturation is 99% on RA, normal by my interpretation.    COORDINATION OF CARE: 5:17 PM-Discussed  treatment plan with pt at bedside and pt agreed to plan.     Labs Review Labs Reviewed - No data to display  Imaging Review No results found.   EKG Interpretation None      MDM   Final diagnoses:  Viral rash   Patient returns emergency department with worsening rash. Patient was treated yesterday with Benadryl alone. Today, she will get a shot of Decadron, 2 weeks of oral steroids and topical triamcinolone. The patient denies any contacts with similar symptoms and I highly doubt scabies as the root origin. However, I have advised the patient that if anyone else in her house seems to develop the same rash or rashes not going away with treatment, she should return for possible treatment of scabies.   I personally performed the services described in this documentation, which was scribed in my presence. The recorded information has been reviewed and is accurate.       Arthor Captainbigail Miliyah Luper, PA-C 01/29/14 1725  Gilda Creasehristopher J. Pollina, MD 01/29/14 [phone]

## 2014-01-29 NOTE — Discharge Instructions (Signed)
We are going to treat this rash with anititch medicine (atarax)[do not take benadryl and atarax] topical steroids and oral steroids. If the rash returns, worsens or does not go away, or anyone in your home develops the same rash, you may need treatment for scabies.    Viral Exanthems, Adult Many viral infections of the skin are called viral exanthems. Exanthem is another name for a rash or skin eruption. The most common viral exanthems include the following:  [place] measles or rubella.  Measles or rubeola.  Roseola.  Parvovirus B19 (Erythema infectiosum or Fifth disease).  Chickenpox or varicella. DIAGNOSIS  Sometimes, other problems may cause a rash that looks like a viral exanthem. Most often, your caregiver can determine whether you have a viral exanthem by looking at the rash. They usually have distinct patterns or appearance. Lab work may be done if the diagnosis is uncertain. Sometimes, a small tissue sample (biopsy) of the rash may need to be taken. TREATMENT  Immunizations have led to a decrease in the number of cases of measles, mumps, and rubella. Viral exanthems may require clinical treatment if a bacterial infection or other problems follow. The rash may be associated with:  Minor sore throat.  Aches and pains.  Runny nose.  Watery eyes.  Tiredness.  Some coughs.  Gastrointestinal infections causing nausea, vomiting, and diarrhea. Viral exanthems do not respond to antibiotic medicines, because they are not caused by bacteria. HOME CARE INSTRUCTIONS   Only take over-the-counter or prescription medicines for pain, discomfort, diarrhea, or fever as directed by your caregiver.  Drink enough water and fluids to keep your urine clear or pale yellow. SEEK MEDICAL CARE IF:  You develop swollen neck glands. This may feel like lumps or bumps in the neck.  You develop tenderness over your sinuses.  You are not feeling partly better after 3 days.  You develop muscle  aches.  You are feeling more tired than you would expect.  You get a persistent cough with mucus. SEEK IMMEDIATE MEDICAL CARE IF:   You have a fever.  You develop red eyes or eye pain.  You develop sores in your mouth and difficulty drinking or eating.  You develop a sore throat with pus and difficulty swallowing.  You develop neck pain or a stiff neck.  You develop a severe headache.  You develop vomiting that will not stop. Document Released: 04/22/2002 Document Revised: 04/24/2011 Document Reviewed: 04/19/2010 [facility] Patient Information 2015 [place], [place]. This information is not intended to replace advice given to you by your health care provider. Make sure you discuss any questions you have with your health care provider.

## 2014-01-29 NOTE — ED Notes (Signed)
Rash onset Tuesday, seen yesterday, returns for worsening rash, no oral swelling or resp distress, no vomiting, A/O X4, ambulatory and in NAD

## 2014-01-29 NOTE — ED Notes (Signed)
Pt comfortable with discharge and follow up instructions. Pt declines wheelchair, escorted to waiting area by this RN. Prescriptions x4. 

## 2014-02-02 ENCOUNTER — Emergency Department (HOSPITAL_COMMUNITY)
Admission: EM | Admit: 2014-02-02 | Discharge: 2014-02-02 | Disposition: A | Discharge status: Home / Self Care | Payer: Self-pay | Attending: Emergency Medicine | Admitting: Emergency Medicine

## 2014-02-02 ENCOUNTER — Encounter (HOSPITAL_COMMUNITY): Payer: Self-pay | Admitting: Family Medicine

## 2014-02-02 DIAGNOSIS — Z7952 Long term (current) use of systemic steroids: Secondary | ICD-10-CM | POA: Insufficient documentation

## 2014-02-02 DIAGNOSIS — L509 Urticaria, unspecified: Secondary | ICD-10-CM | POA: Insufficient documentation

## 2014-02-02 MED ORDER — DIPHENHYDRAMINE HCL 25 MG PO TABS: 25.0000 mg | ORAL_TABLET | Freq: Four times a day (QID) | ORAL | Status: DC

## 2014-02-02 MED ORDER — PREDNISONE 20 MG PO TABS
60.0000 mg | ORAL_TABLET | Freq: Once | ORAL | Status: DC
  Filled 2014-02-02: qty 3

## 2014-02-02 MED ORDER — FAMOTIDINE 20 MG PO TABS
20.0000 mg | ORAL_TABLET | Freq: Once | ORAL | Status: AC
  Administered 2014-02-02: 20 mg via ORAL
  Filled 2014-02-02: qty 1

## 2014-02-02 MED ORDER — DIPHENHYDRAMINE HCL 25 MG PO CAPS
50.0000 mg | ORAL_CAPSULE | Freq: Once | ORAL | Status: AC
  Administered 2014-02-02: 50 mg via ORAL
  Filled 2014-02-02: qty 2

## 2014-02-02 MED ORDER — FAMOTIDINE 20 MG PO TABS: 20.0000 mg | ORAL_TABLET | Freq: Two times a day (BID) | ORAL | Status: DC

## 2014-02-02 NOTE — ED Notes (Signed)
Per pt sts rash that started 2 hours ago at work. Pt here 1 week ago for the same. Rash on chest and neck.

## 2014-02-02 NOTE — Discharge Instructions (Signed)
°  Read the information below.  Use the prescribed medication as directed.  Please discuss all new medications with your pharmacist.  You may return to the Emergency Department at any time for worsening condition or any new symptoms that concern you.  If you develop worsening rash, any swelling or itching in your mouth or throat, or any difficulty swallowing or breathing, call 911 or return to the ER immediately for a recheck.   Hives Hives are itchy, red, swollen areas of the skin. They can vary in size and location on your body. Hives can come and go for hours or several days (acute hives) or for several weeks (chronic hives). Hives do not spread from person to person (noncontagious). They may get worse with scratching, exercise, and emotional stress. CAUSES   Allergic reaction to food, additives, or drugs.  Infections, including the common cold.  Illness, such as vasculitis, lupus, or thyroid disease.  Exposure to sunlight, heat, or cold.  Exercise.  Stress.  Contact with chemicals. SYMPTOMS   Red or white swollen patches on the skin. The patches may change size, shape, and location quickly and repeatedly.  Itching.  Swelling of the hands, feet, and face. This may occur if hives develop deeper in the skin. DIAGNOSIS  Your caregiver can usually tell what is wrong by performing a physical exam. Skin or blood tests may also be done to determine the cause of your hives. In some cases, the cause cannot be determined. TREATMENT  Mild cases usually get better with medicines such as antihistamines. Severe cases may require an emergency epinephrine injection. If the cause of your hives is known, treatment includes avoiding that trigger.  HOME CARE INSTRUCTIONS   Avoid causes that trigger your hives.  Take antihistamines as directed by your caregiver to reduce the severity of your hives. Non-sedating or low-sedating antihistamines are usually recommended. Do not drive while taking an  antihistamine.  Take any other medicines prescribed for itching as directed by your caregiver.  Wear loose-fitting clothing.  Keep all follow-up appointments as directed by your caregiver. SEEK MEDICAL CARE IF:   You have persistent or severe itching that is not relieved with medicine.  You have painful or swollen joints. SEEK IMMEDIATE MEDICAL CARE IF:   You have a fever.  Your tongue or lips are swollen.  You have trouble breathing or swallowing.  You feel tightness in the throat or chest.  You have abdominal pain. These problems may be the first sign of a life-threatening allergic reaction. Call your local emergency services (911 in [place] MAKE SURE YOU:   Understand these instructions.  Will watch your condition.  Will get help right away if you are not doing well or get worse. Document Released: 01/30/2005 Document Revised: 02/04/2013 Document Reviewed: 04/25/2011 [facility] Patient Information 2015 [place], [place]. This information is not intended to replace advice given to you by your health care provider. Make sure you discuss any questions you have with your health care provider.

## 2014-02-02 NOTE — ED Provider Notes (Signed)
CSN: [id number]     Arrival date & time 02/02/14  1511 History  This chart was scribed for non-physician practitioner working with Audree CamelScott T Goldston, MD by Elveria Risingimelie Horne, ED Scribe. This patient was seen in room TR11C/TR11C and the patient's care was started at 4:44 PM.   Chief Complaint  Patient presents with  . Rash   The history is provided by the patient. No language interpreter was used.   HPI Comments: Michelle Yang is a 48 y.o. female who presents to the Emergency Department complaining of sudden onset rash three hours ago while at work. Patient reports itching burning rash located to her chest, neck and breasts. Patient evaluated 12/16 and 12/17 for rash and associated cold symptoms - states that the previous rash was completely different and is resolving. Patient denies itching, swelling in her throat or trouble breathing. Denies dizziness, lightheadedness, nausea, vomiting.  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 Patient denies new dermatological products at home, new clothes, furniture, pets, consumption of new/strange food. Patient denies known allergies. Patient reports that she is on a several week steroid taper; she is now at 60mg .    History reviewed. No pertinent past medical history. Past Surgical History  Procedure Laterality Date  . Ankle fracture surgery Left    History reviewed. No pertinent family history. History  Substance Use Topics  . Smoking status: Never Smoker   . Smokeless tobacco: Not on file  . Alcohol Use: No   OB History    No data available     Review of Systems  Constitutional: Negative for fever and chills.  HENT: Negative for trouble swallowing.   Respiratory: Negative for shortness of breath.   Cardiovascular: Negative for chest pain.  Gastrointestinal: Negative for nausea, vomiting, abdominal pain and diarrhea.  Skin: Positive for rash.  Neurological: Negative for dizziness and  light-headedness.    Allergies  Review of patient's allergies indicates no known allergies.  Home Medications   Prior to Admission medications   Medication Sig Start Date End Date Taking? Authorizing Provider  hydrOXYzine (ATARAX/VISTARIL) 25 MG tablet Take 1 tablet (25 mg total) by mouth every 6 (six) hours. 01/29/14  Yes Abigail Harris, PA-C  Pheniramine-PE-APAP (THERAFLU FLU & SORE THROAT) 20-10-650 MG PACK Take 30 mLs by mouth every 8 (eight) hours as needed (flu).   Yes Historical Provider, MD  predniSONE (DELTASONE) 20 MG tablet Take 3 tablets a day for the 1st 7 days Take 2 tablets a day for the next 7 days. Take 1 tablet a day for 3 days. Take 1/2 tablet daily for the last 4 days. 01/29/14  Yes Arthor CaptainAbigail Harris, PA-C  Triamcinolone Acetonide (TRIAMCINOLONE 0.1 % CREAM : EUCERIN) CREA Apply 1 application topically 2 (two) times daily. Do not apply to face. 01/29/14  Yes Arthor CaptainAbigail Harris, PA-C  triamcinolone ointment (KENALOG) 0.5 % Apply topically 2 (two) times daily. 01/29/14  Yes Arthor CaptainAbigail Harris, PA-C   Triage Vitals: BP 157/99 mmHg  Pulse 89  Temp(Src) 97.5 F (36.4 C)  Resp 20  Wt 195 lb 3 oz (88.536 kg)  SpO2 97%  LMP 01/29/2014 Physical Exam  Constitutional: She appears well-developed and well-nourished. No distress.  HENT:  Head: Normocephalic and atraumatic.  Mouth/Throat: Uvula is midline and oropharynx is clear and moist. Mucous membranes are not dry. No uvula swelling. No oropharyngeal exudate, posterior oropharyngeal edema, posterior oropharyngeal erythema or tonsillar abscesses.  No facial or lip edema  Eyes: Conjunctivae are  normal.  Neck: Normal range of motion. Neck supple.  Cardiovascular: Normal rate and regular rhythm.   Pulmonary/Chest: Effort normal and breath sounds normal. No stridor. No respiratory distress. She has no wheezes. She has no rales.  Neurological: She is alert.  Skin: Rash noted. She is not diaphoretic.  Urticarial rash including lower  back, upper abdomen, bilateral chest and breast and anterior neck.  Psychiatric: She has a normal mood and affect. Her behavior is normal.  Nursing note and vitals reviewed.   ED Course  Procedures (including critical care time)  COORDINATION OF CARE: 4:44 PM- Discussed treatment plan with patient at bedside and patient agreed to plan.   6:22 PM- Patient re evaluated and she much improved.    Labs Review Labs Reviewed - No data to display  Imaging Review No results found.   EKG Interpretation None      MDM   Final diagnoses:  Urticaria    Afebrile, nontoxic patient with urticarial rash over neck and trunk.  No airway symptoms or concerns.  PO prednisone, benadryl, pepcid given and patient observed with great improvement.  Pt on long steroid taper currently.   D/C home with continued prednisone, benadryl, pepcid, allergy follow up.  Discussed result, findings, treatment, and follow up  with patient.  Pt given return precautions.  Pt verbalizes understanding and agrees with plan.       I personally performed the services described in this documentation, which was scribed in my presence. The recorded information has been reviewed and is accurate.    Trixie Dredgemily Tasha Diaz, PA-C 02/02/14 1845  Audree CamelScott T Goldston, MD 02/03/14 [phone]

## 2014-02-05 ENCOUNTER — Emergency Department (HOSPITAL_COMMUNITY)
Admission: EM | Admit: 2014-02-05 | Discharge: 2014-02-05 | Disposition: A | Discharge status: Home / Self Care | Payer: Self-pay | Attending: Emergency Medicine | Admitting: Emergency Medicine

## 2014-02-05 ENCOUNTER — Encounter (HOSPITAL_COMMUNITY): Payer: Self-pay | Admitting: Emergency Medicine

## 2014-02-05 DIAGNOSIS — R0981 Nasal congestion: Secondary | ICD-10-CM | POA: Insufficient documentation

## 2014-02-05 DIAGNOSIS — Z7952 Long term (current) use of systemic steroids: Secondary | ICD-10-CM | POA: Insufficient documentation

## 2014-02-05 DIAGNOSIS — Z79899 Other long term (current) drug therapy: Secondary | ICD-10-CM | POA: Insufficient documentation

## 2014-02-05 DIAGNOSIS — L509 Urticaria, unspecified: Secondary | ICD-10-CM | POA: Insufficient documentation

## 2014-02-05 MED ORDER — DIPHENHYDRAMINE HCL 25 MG PO CAPS
25.0000 mg | ORAL_CAPSULE | Freq: Once | ORAL | Status: AC
  Administered 2014-02-05: 25 mg via ORAL
  Filled 2014-02-05: qty 1

## 2014-02-05 MED ORDER — FAMOTIDINE 20 MG PO TABS
20.0000 mg | ORAL_TABLET | Freq: Once | ORAL | Status: AC
  Administered 2014-02-05: 20 mg via ORAL
  Filled 2014-02-05: qty 1

## 2014-02-05 NOTE — ED Notes (Signed)
Pt complains of rash the arms and legs. Pt has been seen here twice with same complaint.PT states she was given prednisone and benadryl and medications are not working. Pt states, "last night I could not get any sleep it was itching so bad."Pt states rash comes and go. Pt  Denies any different food.

## 2014-02-05 NOTE — Discharge Instructions (Signed)
Hives Hives are itchy, red, swollen areas of the skin. They can vary in size and location on your body. Hives can come and go for hours or several days (acute hives) or for several weeks (chronic hives). Hives do not spread from person to person (noncontagious). They may get worse with scratching, exercise, and emotional stress. CAUSES   Allergic reaction to food, additives, or drugs.  Infections, including the common cold.  Illness, such as vasculitis, lupus, or thyroid disease.  Exposure to sunlight, heat, or cold.  Exercise.  Stress.  Contact with chemicals. SYMPTOMS   Red or white swollen patches on the skin. The patches may change size, shape, and location quickly and repeatedly.  Itching.  Swelling of the hands, feet, and face. This may occur if hives develop deeper in the skin. DIAGNOSIS  Your caregiver can usually tell what is wrong by performing a physical exam. Skin or blood tests may also be done to determine the cause of your hives. In some cases, the cause cannot be determined. TREATMENT  Mild cases usually get better with medicines such as antihistamines. Severe cases may require an emergency epinephrine injection. If the cause of your hives is known, treatment includes avoiding that trigger.  HOME CARE INSTRUCTIONS   Avoid causes that trigger your hives.  Take antihistamines as directed by your caregiver to reduce the severity of your hives. Non-sedating or low-sedating antihistamines are usually recommended. Do not drive while taking an antihistamine.  Take any other medicines prescribed for itching as directed by your caregiver.  Wear loose-fitting clothing.  Keep all follow-up appointments as directed by your caregiver. SEEK MEDICAL CARE IF:   You have persistent or severe itching that is not relieved with medicine.  You have painful or swollen joints. SEEK IMMEDIATE MEDICAL CARE IF:   You have a fever.  Your tongue or lips are swollen.  You have  trouble breathing or swallowing.  You feel tightness in the throat or chest.  You have abdominal pain. These problems may be the first sign of a life-threatening allergic reaction. Call your local emergency services (911 in [place] MAKE SURE YOU:   Understand these instructions.  Will watch your condition.  Will get help right away if you are not doing well or get worse. Document Released: 01/30/2005 Document Revised: 02/04/2013 Document Reviewed: 04/25/2011 Exit[employer] Patient Information 2015 [employer]. This information is not intended to replace advice given to you by your health care provider. Make sure you discuss any questions you have with your health care provider.  

## 2014-02-05 NOTE — ED Provider Notes (Signed)
CSN: [id number]     Arrival date & time 02/05/14  [id number] History   First MD Initiated Contact with Patient 02/05/14 [phone]     Chief Complaint  Patient presents with  . Rash   Michelle Yang is a 48 y.o. female who presents the emergency department complaining of an intermittent rash for the past 8 days associated with itching. The patient was evaluated in emergency department on 12/16, 12/17 and 02/02/2014 and diagnosed with urticaria. Patient had improvement in the ED with prednisone, Benadryl and Pepcid. Patient reports that after her ED visit on 02/02/2014 her rash resolved but returned about 12 hours later. Patient reports her rash is in her bilateral inner thighs and inner arms. She reports that this rash is improved since her previous visit but she is still itching and having trouble sleeping. Patient reports she last drawn Pepcid around 1 AM this morning. Patient has attempted no other treatments today. Patient she last prednisone yesterday morning. The patient is currently on a month-long prednisone taper. When the patient was initially evaluated on 01/28/14 she had an upper respiratory infection. Patient only reports nasal congestion today. Patient reports rash is worse at night. Patient reports she has a dermatologist appointment on 02/16/2014. Patient denies fevers, chills, vaginal discharge, vaginal itching, cough, wheezing, shortness of breath, trouble swallowing, abdominal pain, nausea, vomiting or diarrhea. Patient denies changes to her detergents, lotions, soaps, or perfumes. Patient denies new animals in her home. The patient denies any contacts with similar symptoms.  (Consider location/radiation/quality/duration/timing/severity/associated sxs/prior Treatment) HPI  No past medical history on file. Past Surgical History  Procedure Laterality Date  . Ankle fracture surgery Left    No family history on file. History  Substance Use Topics  . Smoking status: Never Smoker   .  Smokeless tobacco: Not on file  . Alcohol Use: No   OB History    No data available     Review of Systems  Constitutional: Negative for fever and chills.  HENT: Positive for congestion. Negative for ear pain, facial swelling, nosebleeds, postnasal drip, rhinorrhea, sinus pressure, sneezing, sore throat and trouble swallowing.   Eyes: Negative for pain, itching and visual disturbance.  Respiratory: Negative for cough, chest tightness, shortness of breath and wheezing.   Cardiovascular: Negative for chest pain and palpitations.  Gastrointestinal: Negative for nausea, vomiting, abdominal pain and diarrhea.  Genitourinary: Negative for dysuria, hematuria, vaginal bleeding and vaginal discharge.  Musculoskeletal: Negative for myalgias, back pain and neck pain.  Skin: Positive for rash. Negative for wound.  Neurological: Negative for dizziness, weakness, light-headedness, numbness and headaches.  All other systems reviewed and are negative.     Allergies  Review of patient's allergies indicates no known allergies.  Home Medications   Prior to Admission medications   Medication Sig Start Date End Date Taking? Authorizing Provider  diphenhydrAMINE (BENADRYL) 25 MG tablet Take 1 tablet (25 mg total) by mouth every 6 (six) hours. X 3 days then PRN allergic reaction, rash 02/02/14  Yes Trixie DredgeEmily West, PA-C  famotidine (PEPCID) 20 MG tablet Take 1 tablet (20 mg total) by mouth 2 (two) times daily. X 3 days then PRN allergic reaction 02/02/14  Yes Trixie DredgeEmily West, PA-C  predniSONE (DELTASONE) 20 MG tablet Take 3 tablets a day for the 1st 7 days Take 2 tablets a day for the next 7 days. Take 1 tablet a day for 3 days. Take 1/2 tablet daily for the last 4 days. 01/29/14  Yes Arthor CaptainAbigail Harris, PA-C  Triamcinolone  Acetonide (TRIAMCINOLONE 0.1 % CREAM : EUCERIN) CREA Apply 1 application topically 2 (two) times daily. Do not apply to face. 01/29/14  Yes Arthor Captain, PA-C  hydrOXYzine (ATARAX/VISTARIL) 25  MG tablet Take 1 tablet (25 mg total) by mouth every 6 (six) hours. Patient not taking: Reported on 02/05/2014 01/29/14   Arthor Captain, PA-C  triamcinolone ointment (KENALOG) 0.5 % Apply topically 2 (two) times daily. Patient not taking: Reported on 02/05/2014 01/29/14   Arthor Captain, PA-C   BP 138/92 mmHg  Pulse 81  Temp(Src) 98.8 F (37.1 C) (Oral)  Resp 16  Ht 5\' 4"  (1.626 m)  Wt 195 lb (88.451 kg)  BMI 33.46 kg/m2  SpO2 100%  LMP 01/29/2014 Physical Exam  Constitutional: She appears well-developed and well-nourished. No distress.  HENT:  Head: Normocephalic and atraumatic.  Right Ear: External ear normal.  Left Ear: External ear normal.  Nose: Nose normal.  Mouth/Throat: Oropharynx is clear and moist. No oropharyngeal exudate.  Bilateral tympanic membranes are pearly-gray without erythema or loss of landmarks. No rashes noted on face. No facial swelling. Uvula is midline without edema. No tonsillar hypertrophy or exudates.  Eyes: Conjunctivae are normal. Pupils are equal, round, and reactive to light. Right eye exhibits no discharge. Left eye exhibits no discharge.  Neck: Neck supple.  Cardiovascular: Normal rate, regular rhythm, normal heart sounds and intact distal pulses.  Exam reveals no gallop and no friction rub.   No murmur heard. Pulmonary/Chest: Effort normal and breath sounds normal. No respiratory distress. She has no wheezes. She has no rales.  Abdominal: Soft. She exhibits no distension. There is no tenderness.  Musculoskeletal: She exhibits no edema.  Lymphadenopathy:    She has no cervical adenopathy.  Neurological: She is alert. Coordination normal.  Skin: Skin is warm and dry. Rash noted. She is not diaphoretic. No pallor.  Urticarial rash on medial aspect of bilateral upper arms and medial aspect of bilateral thighs. No other rash noted. No rash on her back, buttocks, chest, abdomen or hands.    Psychiatric: She has a normal mood and affect. Her behavior  is normal.  Nursing note and vitals reviewed.   ED Course  Procedures (including critical care time) Labs Review Labs Reviewed - No data to display  Imaging Review No results found.   EKG Interpretation None      Filed Vitals:   02/05/14 0858 02/05/14 0940 02/05/14 1043 02/05/14 1045  BP: 154/104 131/83 132/88 138/92  Pulse: 81 78 83 81  Temp:  98.8 F (37.1 C)    TempSrc:  Oral    Resp:  16    Height:      Weight:      SpO2: 100% 99% 100% 100%     MDM   Meds given in ED:  Medications  diphenhydrAMINE (BENADRYL) capsule 25 mg (25 mg Oral Given 02/05/14 0912)  famotidine (PEPCID) tablet 20 mg (20 mg Oral Given 02/05/14 0912)    Discharge Medication List as of 02/05/2014 11:16 AM      Final diagnoses:  Urticaria   Kanyon A Reitan is a 48 y.o. female who presents the emergency department complaining of an intermittent rash for the past 8 days associated with itching. The patient was evaluated in emergency department on 12/16, 12/17 and 02/02/2014 and diagnosed with urticaria. Patient is currently on a month-long prednisone taper. Patient reports her rash is improved however it has not completely resolved. Patient is afebrile and nontoxic-appearing. Patient has a urticarial rash on her  bilateral inner thighs and bilateral inner arms. This improved somewhat during her course in ED. Is also an improvement from her previous ED visits. Due to the improvement with benadryl and pepcid it is unlikely this is fungal.  Advised patient that she needed to continue taking Benadryl and Pepcid for her rash. Advised her to continue taking the prednisone taper. Patient has an appointment with the dermatologist on 02/16/2014. I advised her to keep this appointment. Advised patient return to the emergency department with new or worsening symptoms or new concerns. Advised her to return with any difficulty breathing, worsening rash or fevers. The patient verbalized understanding and  agreement with plan.  This patient was discussed with and evaluated by Dr. Micheline Mazeocherty who agrees with assessment and plan.     Lawana ChambersWilliam Duncan Cathern Tahir, PA-C 02/05/14 1258  Toy CookeyMegan Docherty, MD 02/05/14 2005

## 2016-10-07 ENCOUNTER — Emergency Department (HOSPITAL_COMMUNITY)
Admission: EM | Admit: 2016-10-07 | Discharge: 2016-10-07 | Disposition: A | Discharge status: Home / Self Care | Payer: Self-pay | Attending: Emergency Medicine | Admitting: Emergency Medicine

## 2016-10-07 ENCOUNTER — Encounter (HOSPITAL_COMMUNITY): Payer: Self-pay | Admitting: *Deleted

## 2016-10-07 DIAGNOSIS — Z79899 Other long term (current) drug therapy: Secondary | ICD-10-CM | POA: Insufficient documentation

## 2016-10-07 DIAGNOSIS — R21 Rash and other nonspecific skin eruption: Secondary | ICD-10-CM | POA: Insufficient documentation

## 2016-10-07 MED ORDER — HYDROXYZINE HCL 25 MG PO TABS: 25.0000 mg | ORAL_TABLET | Freq: Four times a day (QID) | ORAL | 0 refills | Status: DC | PRN

## 2016-10-07 MED ORDER — PREDNISONE 20 MG PO TABS: ORAL_TABLET | ORAL | 0 refills | Status: DC

## 2016-10-07 NOTE — ED Triage Notes (Signed)
Patient with rash to arms and back of neck. Patient states she is allergic to the sun. Patient reports she is usually treated with steriods.airway intact. No distress.

## 2016-10-07 NOTE — Discharge Instructions (Signed)
Take prednisone as prescribed. Take hydroxyzine as needed for itching. Follow-up with the dermatologist in one week if symptoms are not improving. Return to the emergency room if you develop persistent high fevers, vision changes, swelling of tongue, lips, or throat, difficulty breathing, or any new or worsening symptoms.

## 2016-10-07 NOTE — ED Provider Notes (Signed)
[facility]-EMERGENCY DEPT Provider Note   CSN: [id number] Arrival date & time: 10/07/16  1536     History   Chief Complaint Chief Complaint  Patient presents with  . Rash    HPI Michelle Yang is a 51 y.o. female resenting with 3 days rash.  Patient states that 3 days ago, she was shaving tiles that are put on the ceiling. She had some of the debris fall onto her arms and around her neck. She developed some itching of these areas, but it was minimal. Yesterday, the itching increased and the rash became more pronounced. So far nothing has made it better, and heat makes the rash worse. Patient states she has a history of rash due to sun exposure, and in general has very sensitive skin. Usually when she goes out in the sun, she gets minimal rash well-controlled with topical creams. Patient has been using 2.5% hydrocortisone cream on the rash without relief. She reports a new detergent started several days prior, but she thought this might be the source of her rash, has stopped using that detergent and rewash all her close in a brain she has used before without issues. She denies any new food, exposures, soaps, shampoos, or environments. She denies lip, tongue, or throat swelling. She denies chest pain or shortness of breath. No one else at home has a rash. She denies fevers or chills. Rash is not painful. She has never seen a dermatologist.  HPI  History reviewed. No pertinent past medical history.  There are no active problems to display for this patient.   Past Surgical History:  Procedure Laterality Date  . ANKLE FRACTURE SURGERY Left     OB History    No data available       Home Medications    Prior to Admission medications   Medication Sig Start Date End Date Taking? Authorizing Provider  diphenhydrAMINE (BENADRYL) 25 MG tablet Take 1 tablet (25 mg total) by mouth every 6 (six) hours. X 3 days then PRN allergic reaction, rash 02/02/14   West, Emily, PA-C  famotidine  (PEPCID) 20 MG tablet Take 1 tablet (20 mg total) by mouth 2 (two) times daily. X 3 days then PRN allergic reaction 02/02/14   Trixie Dredge, PA-C  hydrOXYzine (ATARAX/VISTARIL) 25 MG tablet Take 1 tablet (25 mg total) by mouth every 6 (six) hours as needed for itching. 10/07/16   Gilad Dugger, PA-C  predniSONE (DELTASONE) 20 MG tablet Take 3 tablets a day for the 1st 5 days Take 2 tablets a day for the next 5 days. Take 1 tablet a day for 3 days. Take 1/2 tablet daily for the last 4 days. 10/07/16   Latrease Kunde, PA-C  Triamcinolone Acetonide (TRIAMCINOLONE 0.1 % CREAM : EUCERIN) CREA Apply 1 application topically 2 (two) times daily. Do not apply to face. 01/29/14   Arthor Captain, PA-C  triamcinolone ointment (KENALOG) 0.5 % Apply topically 2 (two) times daily. Patient not taking: Reported on 02/05/2014 01/29/14   Arthor Captain, PA-C    Family History History reviewed. No pertinent family history.  Social History Social History  Substance Use Topics  . Smoking status: Never Smoker  . Smokeless tobacco: Not on file  . Alcohol use No     Allergies   Patient has no known allergies.   Review of Systems Review of Systems  Constitutional: Negative for chills and fever.  HENT: Negative for facial swelling and trouble swallowing.   Respiratory: Negative for cough, chest tightness and  shortness of breath.   Cardiovascular: Negative for chest pain.  Gastrointestinal: Negative for nausea and vomiting.  Skin: Positive for rash.     Physical Exam Updated Vital Signs BP (!) 160/100 (BP Location: Left Arm)   Pulse 81   Temp 98.3 F (36.8 C) (Oral)   Resp 17   SpO2 100%   Physical Exam  Constitutional: She is oriented to person, place, and time. She appears well-developed and well-nourished. No distress.  HENT:  Head: Normocephalic and atraumatic.  Mouth/Throat: Uvula is midline, oropharynx is clear and moist and mucous membranes are normal.  No obvious lip, tongue, or  throat swelling.  Eyes: EOM are normal.  Neck: Normal range of motion.  Cardiovascular: Normal rate, regular rhythm and intact distal pulses.   Pulmonary/Chest: Effort normal and breath sounds normal. No respiratory distress. She has no wheezes.  Patient without difficulty breathing  Abdominal: She exhibits no distension.  Musculoskeletal: Normal range of motion.  Neurological: She is alert and oriented to person, place, and time.  Skin: Skin is warm. Rash noted.  Macular rash with minimal erythema surrounding the base of the neck, and bilateral arms from wrist to shirt sleeve. No rash and lower extremities, trunk, or areas that are blocked by clothes. No discharge from the rash. Rash has not spread to the face. No rash on the palms of the hands or in the web space.  Psychiatric: She has a normal mood and affect.  Nursing note and vitals reviewed.    ED Treatments / Results  Labs (all labs ordered are listed, but only abnormal results are displayed) Labs Reviewed - No data to display  EKG  EKG Interpretation None       Radiology No results found.  Procedures Procedures (including critical care time)  Medications Ordered in ED Medications - No data to display   Initial Impression / Assessment and Plan / ED Course  I have reviewed the triage vital signs and the nursing notes.  Pertinent labs & imaging results that were available during my care of the patient were reviewed by me and considered in my medical decision making (see chart for details).     Patient presenting with rash for the past 3 days, worsened recently. She's been trying topical cortisone cream without relief. History of rashes. Physical exam shows rash of areas exposed including the neck and bilateral arms. Appears consistent with contact dermatitis. Doubt SJS, TEN, or RMSF. No fevers or chills. No airway compromise or respiratory distress. Will treat with prednisone and Vistaril, both of which patient has  tolerated well in the past. Patient does not have a history of diabetes. At this time, patient appears safe discharge. Will provide contact information for dermatology. Return precautions given. Patient states she understands and agrees to plan.  Final Clinical Impressions(s) / ED Diagnoses   Final diagnoses:  Rash and nonspecific skin eruption    New Prescriptions Discharge Medication List as of 10/07/2016  6:56 PM       Alveria Apley, PA-C 10/07/16 Eloise Harman, MD 10/17/16 [phone]

## 2016-10-07 NOTE — ED Notes (Signed)
Declined W/C at D/C and was escorted to lobby by RN. 

## 2017-01-17 ENCOUNTER — Encounter (HOSPITAL_COMMUNITY): Payer: Self-pay

## 2017-01-17 ENCOUNTER — Emergency Department (HOSPITAL_COMMUNITY): Payer: Self-pay

## 2017-01-17 DIAGNOSIS — B349 Viral infection, unspecified: Secondary | ICD-10-CM | POA: Insufficient documentation

## 2017-01-17 DIAGNOSIS — K219 Gastro-esophageal reflux disease without esophagitis: Secondary | ICD-10-CM | POA: Insufficient documentation

## 2017-01-17 LAB — BASIC METABOLIC PANEL
Anion gap: 11 (ref 5–15)
BUN: 5 mg/dL — ABNORMAL LOW (ref 6–20)
CALCIUM: 9.2 mg/dL (ref 8.9–10.3)
CO2: 26 mmol/L (ref 22–32)
CREATININE: 0.72 mg/dL (ref 0.44–1.00)
Chloride: 103 mmol/L (ref 101–111)
Glucose, Bld: 125 mg/dL — ABNORMAL HIGH (ref 65–99)
Potassium: 3.4 mmol/L — ABNORMAL LOW (ref 3.5–5.1)
Sodium: 140 mmol/L (ref 135–145)

## 2017-01-17 LAB — CBC
HCT: 43.3 % (ref 36.0–46.0)
Hemoglobin: 14 g/dL (ref 12.0–15.0)
MCH: 27.5 pg (ref 26.0–34.0)
MCHC: 32.3 g/dL (ref 30.0–36.0)
MCV: 84.9 fL (ref 78.0–100.0)
PLATELETS: 281 10*3/uL (ref 150–400)
RBC: 5.1 MIL/uL (ref 3.87–5.11)
RDW: 14.3 % (ref 11.5–15.5)
WBC: 6.5 10*3/uL (ref 4.0–10.5)

## 2017-01-17 LAB — I-STAT TROPONIN, ED: TROPONIN I, POC: 0 ng/mL (ref 0.00–0.08)

## 2017-01-17 LAB — I-STAT BETA HCG BLOOD, ED (MC, WL, AP ONLY): I-stat hCG, quantitative: <5 m[IU]/mL (ref <5)

## 2017-01-17 IMAGING — DX DG CHEST 2V
2 series · 2 of 2 positions shown · non-contrast
Comparison: [DATE] report

CLINICAL DATA: 51-year-old female with mid chest pain and
productive cough x4 days with dyspnea.

EXAM:
CHEST  2 VIEW

[chest pa]
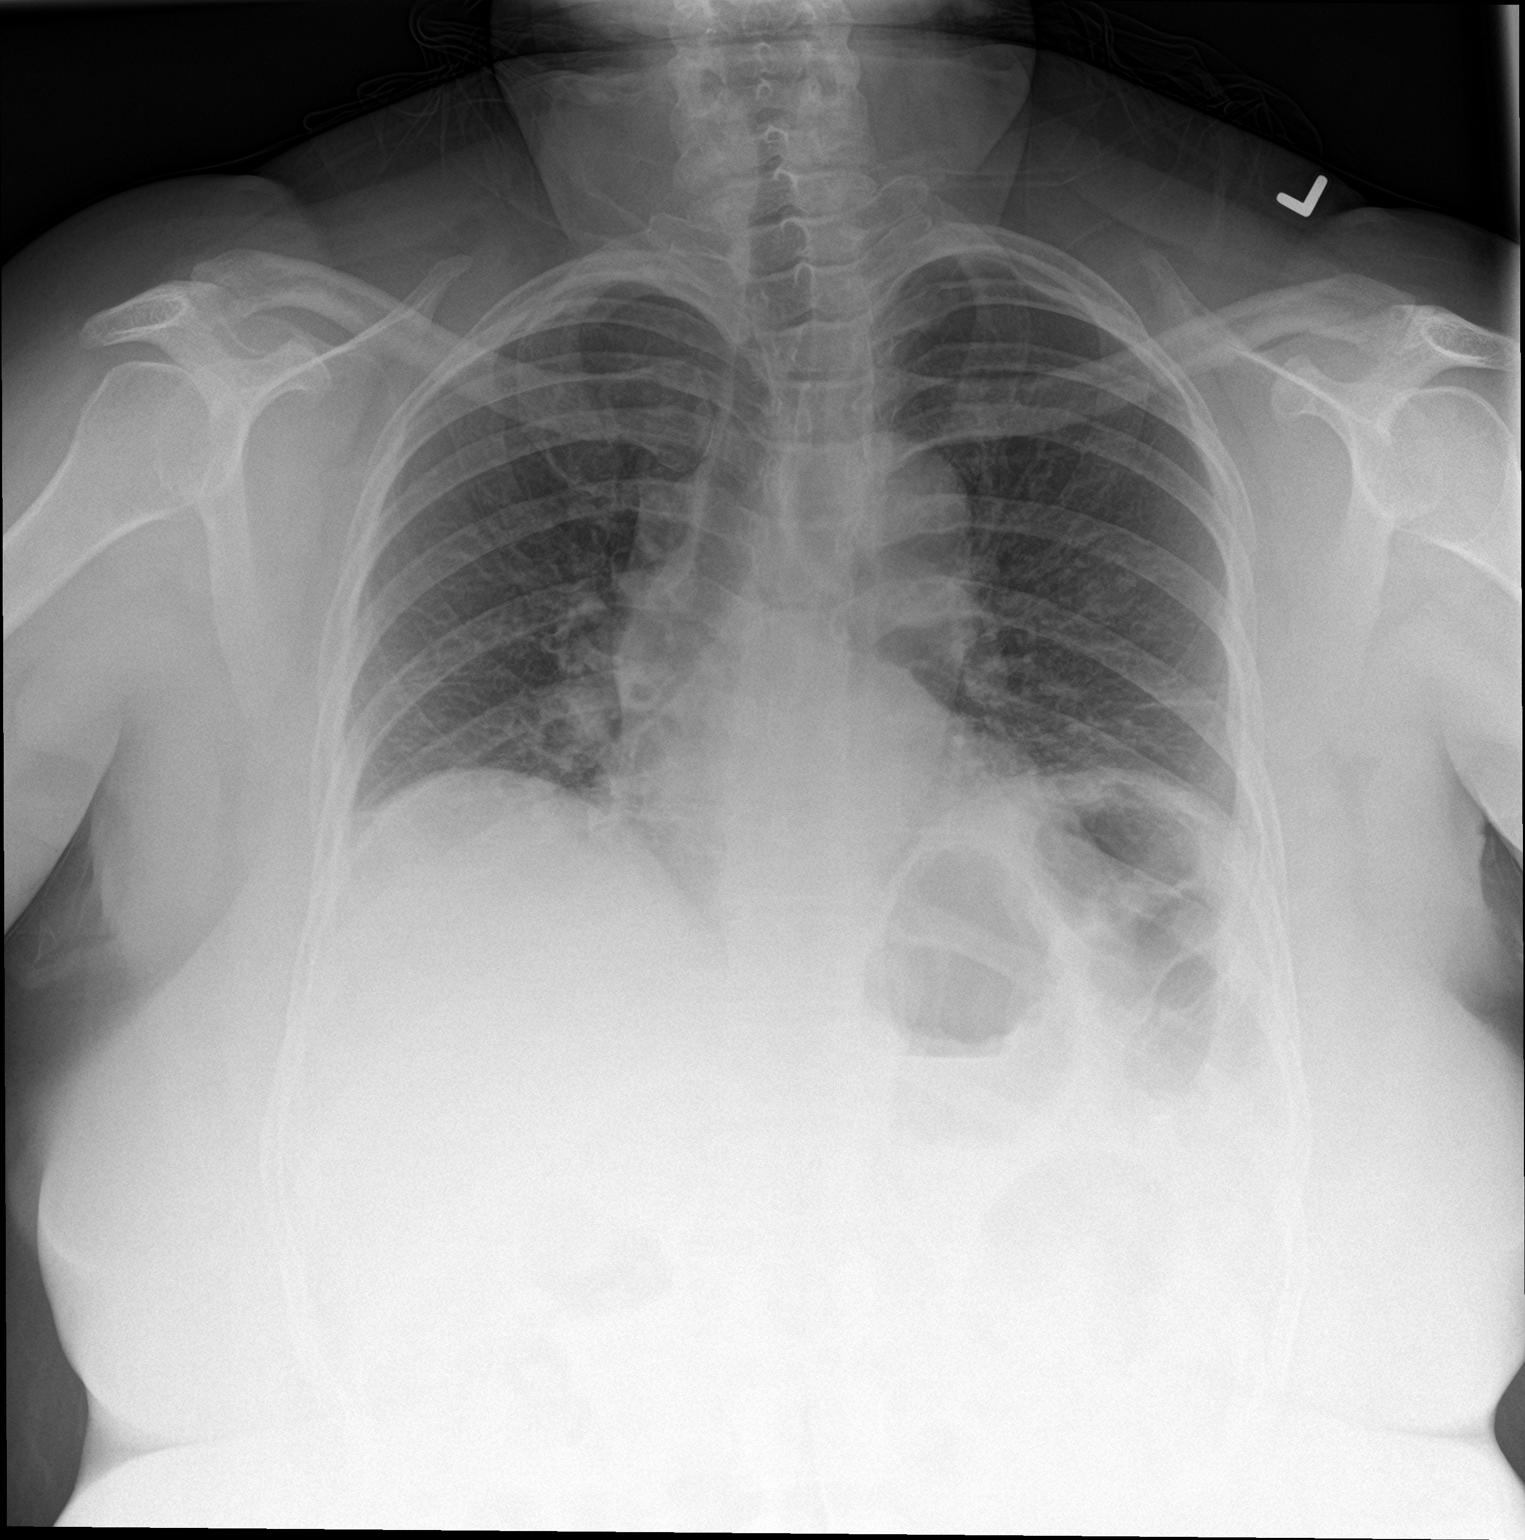

[chest lat]
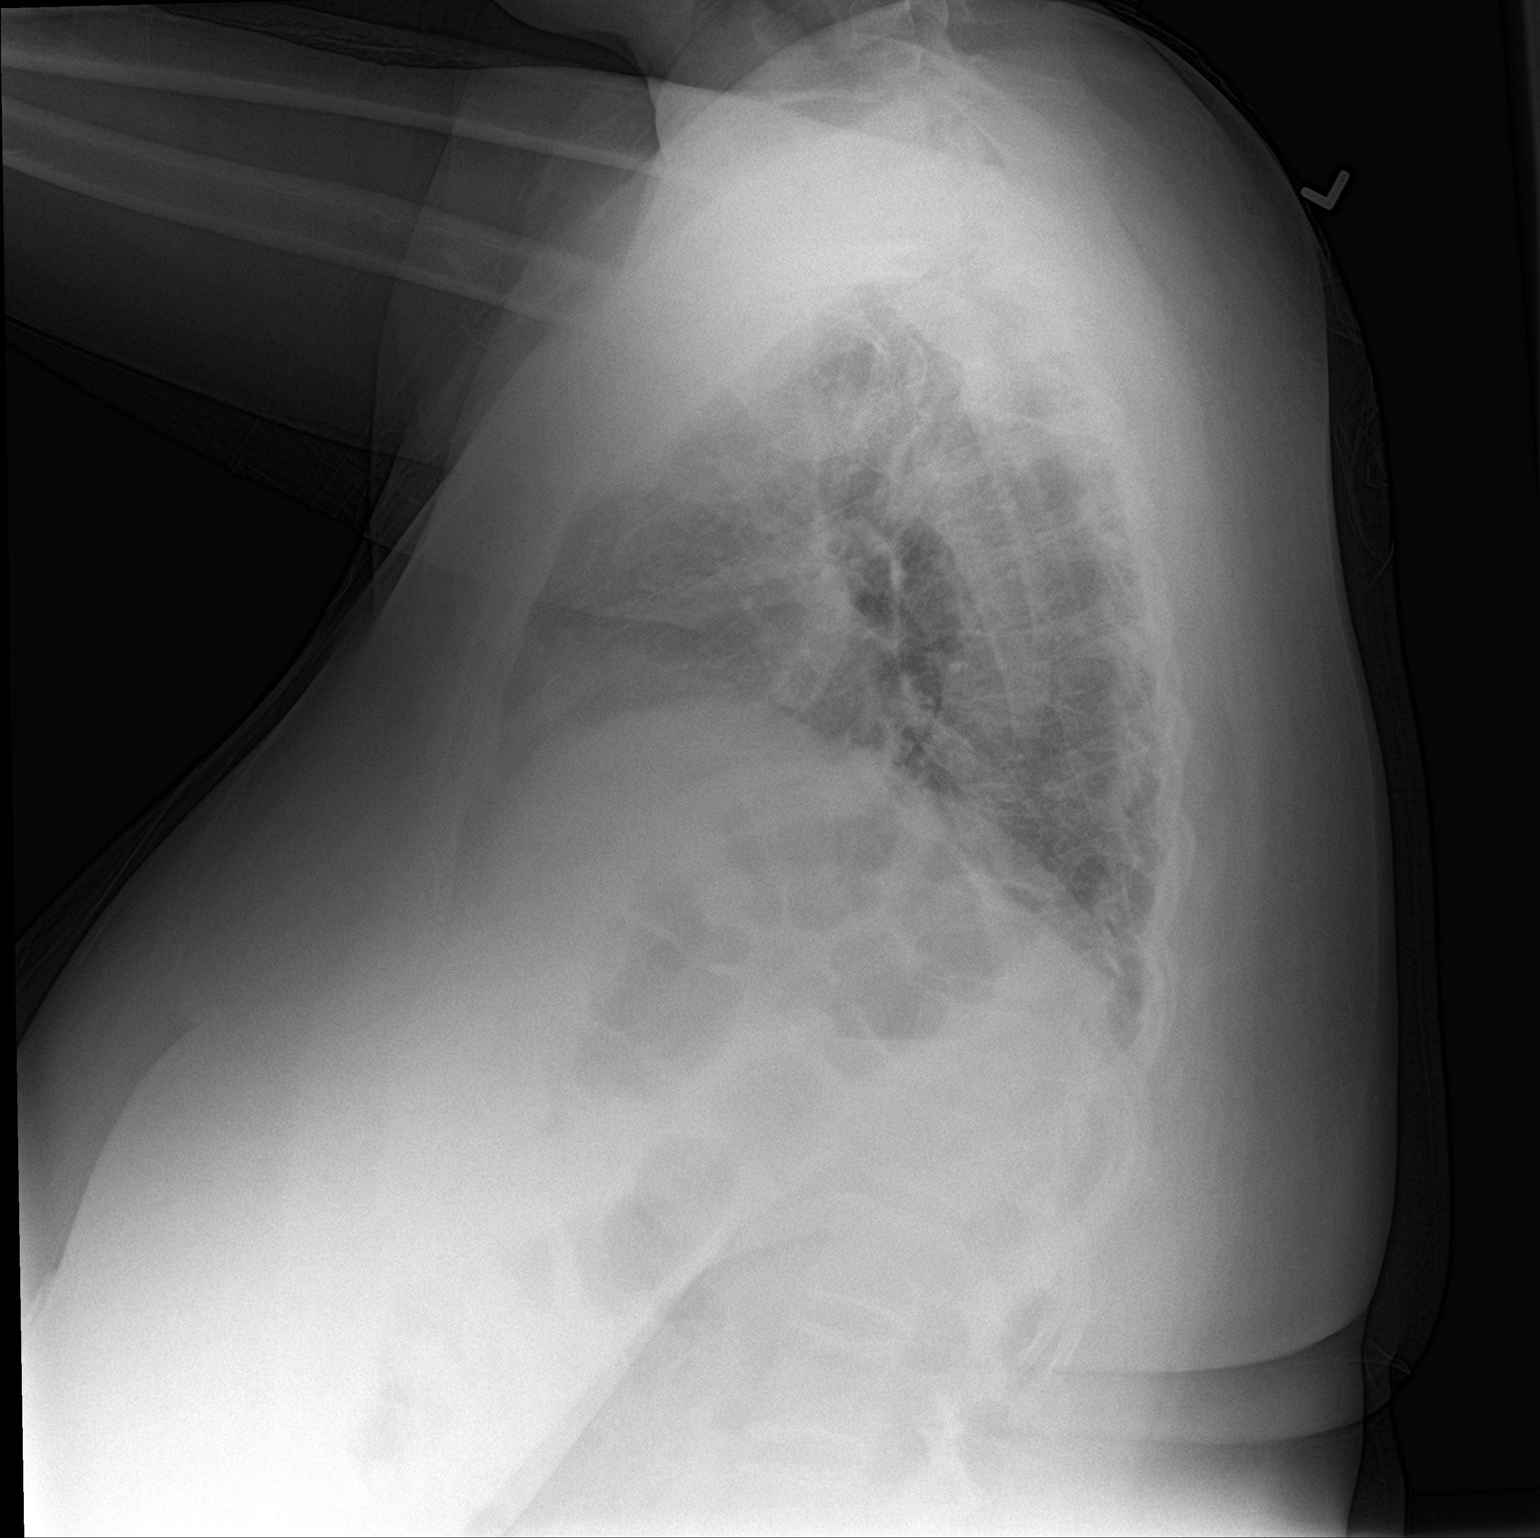

[2 of 2 positions shown; findings below may reference images not displayed]

FINDINGS: Low lung volumes with bibasilar atelectasis. No pneumonic
consolidation nor CHF. Normal size heart and mediastinal contours.
No acute nor suspicious osseous abnormalities.
IMPRESSION: Low lung volumes with bibasilar atelectasis.

## 2017-01-17 NOTE — ED Triage Notes (Signed)
Pt states that since Saturday she has been fatigued, sore throat, painful with swallowing , R ear pain and fevers along with CP and SOB when coughing, reports yellow sputum.

## 2017-01-18 ENCOUNTER — Emergency Department (HOSPITAL_COMMUNITY)
Admission: EM | Admit: 2017-01-18 | Discharge: 2017-01-18 | Disposition: A | Discharge status: Home / Self Care | Payer: Self-pay | Attending: Emergency Medicine | Admitting: Emergency Medicine

## 2017-01-18 DIAGNOSIS — K219 Gastro-esophageal reflux disease without esophagitis: Secondary | ICD-10-CM

## 2017-01-18 DIAGNOSIS — B349 Viral infection, unspecified: Secondary | ICD-10-CM

## 2017-01-18 LAB — URINALYSIS, ROUTINE W REFLEX MICROSCOPIC
BILIRUBIN URINE: NEGATIVE
GLUCOSE, UA: NEGATIVE mg/dL
HGB URINE DIPSTICK: NEGATIVE
KETONES UR: 5 mg/dL — AB
Leukocytes, UA: NEGATIVE
Nitrite: NEGATIVE
PH: 5 (ref 5.0–8.0)
PROTEIN: NEGATIVE mg/dL
Specific Gravity, Urine: 1.029 (ref 1.005–1.030)

## 2017-01-18 LAB — RAPID STREP SCREEN (MED CTR MEBANE ONLY): Streptococcus, Group A Screen (Direct): NEGATIVE

## 2017-01-18 MED ORDER — PANTOPRAZOLE SODIUM 40 MG PO TBEC: 40.0000 mg | DELAYED_RELEASE_TABLET | Freq: Every day | ORAL | 0 refills | Status: DC

## 2017-01-18 MED ORDER — ONDANSETRON HCL 4 MG/2ML IJ SOLN
4.0000 mg | Freq: Once | INTRAMUSCULAR | Status: AC
  Administered 2017-01-18: 4 mg via INTRAVENOUS
  Filled 2017-01-18: qty 2

## 2017-01-18 MED ORDER — PANTOPRAZOLE SODIUM 40 MG IV SOLR
40.0000 mg | Freq: Once | INTRAVENOUS | Status: AC
  Administered 2017-01-18: 40 mg via INTRAVENOUS
  Filled 2017-01-18: qty 40

## 2017-01-18 MED ORDER — KETOROLAC TROMETHAMINE 30 MG/ML IJ SOLN
30.0000 mg | Freq: Once | INTRAMUSCULAR | Status: AC
  Administered 2017-01-18: 30 mg via INTRAVENOUS
  Filled 2017-01-18: qty 1

## 2017-01-18 MED ORDER — ONDANSETRON 4 MG PO TBDP: 4.0000 mg | ORAL_TABLET | Freq: Four times a day (QID) | ORAL | 0 refills | Status: DC | PRN

## 2017-01-18 MED ORDER — ALBUTEROL SULFATE HFA 108 (90 BASE) MCG/ACT IN AERS
2.0000 | INHALATION_SPRAY | Freq: Once | RESPIRATORY_TRACT | Status: AC
  Administered 2017-01-18: 2 via RESPIRATORY_TRACT
  Filled 2017-01-18: qty 6.7

## 2017-01-18 MED ORDER — ALBUTEROL SULFATE (2.5 MG/3ML) 0.083% IN NEBU
5.0000 mg | INHALATION_SOLUTION | Freq: Once | RESPIRATORY_TRACT | Status: AC
  Administered 2017-01-18: 5 mg via RESPIRATORY_TRACT
  Filled 2017-01-18: qty 6

## 2017-01-18 MED ORDER — BENZONATATE 100 MG PO CAPS: 100.0000 mg | ORAL_CAPSULE | Freq: Three times a day (TID) | ORAL | 0 refills | Status: DC | PRN

## 2017-01-18 MED ORDER — SODIUM CHLORIDE 0.9 % IV BOLUS (SEPSIS)
1000.0000 mL | Freq: Once | INTRAVENOUS | Status: AC
  Administered 2017-01-18: 1000 mL via INTRAVENOUS

## 2017-01-18 NOTE — ED Notes (Signed)
MD in to see pt.

## 2017-01-18 NOTE — ED Provider Notes (Signed)
TIME SEEN: 2:00 AM   CHIEF COMPLAINT: Cough, sore throat, ear pain, nausea and vomiting  HPI: Patient is a 51 year old female with no known past medical history who presents the emergency department feeling like she is getting sick.  She states that on Saturday, December 1 she felt like she was "getting a cold".  She states her throat started hurting and she had a dry cough and shortness of breath with exertion.  States she developed right ear pain and then nausea and vomiting.  No diarrhea.  No abdominal pain.  Denies any known fever but states she has been taking round-the-clock antipyretics.  States she is having chest discomfort with coughing and some acid reflux when she is lying down.  No other chest pain.  No exertional chest pain.  No sick contacts except for her grandson she states he did not have similar symptoms.  She has not had an influenza vaccination.  No recent travel.   ROS: See HPI Constitutional: no fever  Eyes: no drainage  ENT: no runny nose   Cardiovascular:   chest pain with coughing Resp: no SOB  GI:  vomiting GU: no dysuria Integumentary: no rash  Allergy: no hives  Musculoskeletal: no leg swelling  Neurological: no slurred speech ROS otherwise negative  PAST MEDICAL HISTORY/PAST SURGICAL HISTORY:  History reviewed. No pertinent past medical history.  MEDICATIONS:  Prior to Admission medications   Medication Sig Start Date End Date Taking? Authorizing Provider  Diphenhydramine-PE-APAP (THERAFLU EXPRESSMAX) 12.5-5-325 MG/15ML LIQD Take 30 mLs by mouth every 6 (six) hours as needed (for symptoms).   Yes [provider]  diphenhydrAMINE (BENADRYL) 25 MG tablet Take 1 tablet (25 mg total) by mouth every 6 (six) hours. X 3 days then PRN allergic reaction, rash Patient not taking: Reported on 01/17/2017 02/02/14   Trixie DredgeWest, Emily, PA-C  famotidine (PEPCID) 20 MG tablet Take 1 tablet (20 mg total) by mouth 2 (two) times daily. X 3 days then PRN allergic  reaction Patient not taking: Reported on 01/17/2017 02/02/14   Trixie DredgeWest, Emily, PA-C  hydrOXYzine (ATARAX/VISTARIL) 25 MG tablet Take 1 tablet (25 mg total) by mouth every 6 (six) hours as needed for itching. Patient not taking: Reported on 01/17/2017 10/07/16   Caccavale, Sophia, PA-C  predniSONE (DELTASONE) 20 MG tablet Take 3 tablets a day for the 1st 5 days Take 2 tablets a day for the next 5 days. Take 1 tablet a day for 3 days. Take 1/2 tablet daily for the last 4 days. Patient not taking: Reported on 01/17/2017 10/07/16   Caccavale, Sophia, PA-C  Triamcinolone Acetonide (TRIAMCINOLONE 0.1 % CREAM : EUCERIN) CREA Apply 1 application topically 2 (two) times daily. Do not apply to face. Patient not taking: Reported on 01/17/2017 01/29/14   Arthor CaptainHarris, Abigail, PA-C  triamcinolone ointment (KENALOG) 0.5 % Apply topically 2 (two) times daily. Patient not taking: Reported on 01/17/2017 01/29/14   Arthor CaptainHarris, Abigail, PA-C    ALLERGIES:  No Known Allergies  SOCIAL HISTORY:  Social History   Tobacco Use  . Smoking status: Never Smoker  . Smokeless tobacco: Never Used  Substance Use Topics  . Alcohol use: No    FAMILY HISTORY: No family history on file.  EXAM: BP 134/88   Pulse (!) 115   Temp 98.9 F (37.2 C) (Oral)   Resp 16   LMP 01/13/2017 Comment: saturday  SpO2 99%  CONSTITUTIONAL: Alert and oriented and responds appropriately to questions. Well-appearing; well-nourished HEAD: Normocephalic EYES: Conjunctivae clear, pupils appear equal,  EOMI ENT: normal nose; moist mucous membranes; patient does have posterior pharyngeal erythema without petechiae, mild tonsillar hypertrophy without exudate, no uvular deviation, no unilateral swelling, no trismus or drooling, no muffled voice, normal phonation, no stridor, no dental caries present, no drainable dental abscess noted, no Ludwig's angina, tongue sits flat in the bottom of the mouth, no angioedema, no facial erythema or warmth, no facial  swelling; no pain with movement of the neck.  TMs are clear bilaterally without erythema, purulence, bulging, perforation, effusion.  No cerumen impaction or sign of foreign body in the external auditory canal. No inflammation, erythema or drainage from the external auditory canal. No signs of mastoiditis. No pain with manipulation of the pinna bilaterally. NECK: Supple, no meningismus, no nuchal rigidity, no LAD  CARD: Regular and tachycardic; S1 and S2 appreciated; no murmurs, no clicks, no rubs, no gallops RESP: Normal chest excursion without splinting or tachypnea; breath sounds equal bilaterally; + expiratory wheezes, no rhonchi, no rales, no hypoxia or respiratory distress, speaking full sentences ABD/GI: Normal bowel sounds; non-distended; soft, non-tender, no rebound, no guarding, no peritoneal signs, no hepatosplenomegaly BACK:  The back appears normal and is non-tender to palpation, there is no CVA tenderness EXT: Normal ROM in all joints; non-tender to palpation; no edema; normal capillary refill; no cyanosis, no calf tenderness or swelling    SKIN: Normal color for age and race; warm; no rash NEURO: Moves all extremities equally PSYCH: The patient's mood and manner are appropriate. Grooming and personal hygiene are appropriate.  MEDICAL DECISION MAKING: Patient here with flulike symptoms.  Suspect viral illness.  Chest pain seems very atypical.  Mostly with coughing and associated with lying flat which she describes as feeling like similar to her previous episodes of acid reflux.  She does not appear septic here.  No sign of meningitis.  She does have some intermittent wheezing.  We will treat symptomatically with IV fluids, Protonix, Zofran, Toradol and albuterol nebulizer treatment.  Patient's chest x-ray shows no pneumonia.  Will obtain urinalysis.  Labs are unremarkable.  Troponin is negative.  Strep test has been sent and is also negative.  Discussed with patient that we do not do routine  flu testing from the emergency department.  She is comfortable with this plan.  It would not change my management given patient is outside of treatment window for Tamiflu and at this time I do not feel she needs admission.  ED PROGRESS: Patient's strep test is negative.  Urine shows no sign of infection and only small ketones.  She reports feeling "much better than I have in a while".  Her heart rate did improve but came back up after albuterol treatment.  Will discharge with prescriptions of albuterol inhaler, Tessalon Perles, Zofran and Protonix for symptomatic relief.  Recommended alternating Tylenol and Motrin for fever and pain.  Patient comfortable with this plan.  Again suspect viral illness.  I do not feel she needs antibiotics.  I do not think this is ACS, PE, dissection causing her chest pain.  Seems to be atypical and worse with coughing.    At this time, I do not feel there is any life-threatening condition present. I have reviewed and discussed all results (EKG, imaging, lab, urine as appropriate) and exam findings with patient/family. I have reviewed nursing notes and appropriate previous records.  I feel the patient is safe to be discharged home without further emergent workup and can continue workup as an outpatient as needed. Discussed usual and customary  return precautions. Patient/family verbalize understanding and are comfortable with this plan.  Outpatient follow-up has been provided if needed. All questions have been answered.    EKG Interpretation  Date/Time:  Wednesday January 17 2017 19:40:40 EST Ventricular Rate:  119 PR Interval:  144 QRS Duration: 66 QT Interval:  314 QTC Calculation: 441 R Axis:   27 Text Interpretation:  Sinus tachycardia Minimal voltage criteria for LVH, may be normal variant T wave abnormality, consider inferior ischemia Abnormal ECG Confirmed by Rochele Raring [phone]) on 01/18/2017 1:59:18 AM           Kahleel Fadeley, Layla Maw, DO 01/18/17 6045

## 2017-01-18 NOTE — Discharge Instructions (Addendum)
You may alternate Tylenol 1000 mg every 6 hours as needed for pain and Ibuprofen 800 mg every 8 hours as needed for pain.  Please take Ibuprofen with food.  You may use your albuterol inhaler 2 puffs every 2-4 hours as needed for wheezing and shortness of breath.   To find a primary care or specialty doctor please call [phone] or [phone] to access "[place] Find a Doctor Service."  You may also go on the [facility] website at [email or web address]  There are also multiple [facility], [facility], [facility] and [facility] practices throughout the [place] that are frequently accepting new patients. You may find a clinic that is close to your home and contact them.  [facility] -  [address] [place] [phone]   [facility] -  [address] [place] [id number] [phone]   [facility] [phone] [phone]

## 2017-01-18 NOTE — ED Notes (Signed)
Patient denies pain and is resting comfortably.  

## 2017-01-20 LAB — CULTURE, GROUP A STREP (THRC)

## 2018-02-03 ENCOUNTER — Encounter (HOSPITAL_COMMUNITY): Payer: Self-pay

## 2018-02-03 ENCOUNTER — Other Ambulatory Visit: Payer: Self-pay

## 2018-02-03 ENCOUNTER — Emergency Department (HOSPITAL_COMMUNITY)
Admission: EM | Admit: 2018-02-03 | Discharge: 2018-02-03 | Disposition: A | Discharge status: Home / Self Care | Payer: Self-pay | Attending: Emergency Medicine | Admitting: Emergency Medicine

## 2018-02-03 DIAGNOSIS — Z79899 Other long term (current) drug therapy: Secondary | ICD-10-CM | POA: Insufficient documentation

## 2018-02-03 DIAGNOSIS — N814 Uterovaginal prolapse, unspecified: Secondary | ICD-10-CM

## 2018-02-03 DIAGNOSIS — N8110 Cystocele, unspecified: Secondary | ICD-10-CM | POA: Insufficient documentation

## 2018-02-03 NOTE — Discharge Instructions (Signed)
Schedule appointment with the GYN clinic for evaluation

## 2018-02-03 NOTE — ED Triage Notes (Signed)
Pt to ER with c/o abnormal protrusion from vaginal area; pt states that she was brushing her teeth and she felt like something was coming out of her.

## 2018-02-03 NOTE — ED Provider Notes (Signed)
[facility] Provider Note   CSN: [id number] Arrival date & time: 02/03/18  0744     History   Chief Complaint Chief Complaint  Patient presents with  . Vaginal Discharge    HPI Michelle Yang is a 52 y.o. female.  Pt reports she has a mass protruding out of vaginal vault.  Pt worried that she has a tumor.  Pt reports difficulty urinating  The history is provided by the patient. No language interpreter was used.  Vaginal Discharge   She has tried nothing for the symptoms. The treatment provided moderate relief. Her past medical history does not include STD.    History reviewed. No pertinent past medical history.  There are no active problems to display for this patient.   Past Surgical History:  Procedure Laterality Date  . ANKLE FRACTURE SURGERY Left      OB History   No obstetric history on file.      Home Medications    Prior to Admission medications   Medication Sig Start Date End Date Taking? Authorizing Provider  benzonatate (TESSALON) 100 MG capsule Take 1 capsule (100 mg total) by mouth 3 (three) times daily as needed for cough. 01/18/17   Ward, Layla MawKristen N, DO  diphenhydrAMINE (BENADRYL) 25 MG tablet Take 1 tablet (25 mg total) by mouth every 6 (six) hours. X 3 days then PRN allergic reaction, rash Patient not taking: Reported on 01/17/2017 02/02/14   Trixie DredgeWest, Emily, PA-C  Diphenhydramine-PE-APAP [facility](THERAFLU EXPRESSMAX) 12.5-5-325 MG/15ML LIQD Take 30 mLs by mouth every 6 (six) hours as needed (for symptoms).    [provider]  famotidine (PEPCID) 20 MG tablet Take 1 tablet (20 mg total) by mouth 2 (two) times daily. X 3 days then PRN allergic reaction Patient not taking: Reported on 01/17/2017 02/02/14   Trixie DredgeWest, Emily, PA-C  hydrOXYzine (ATARAX/VISTARIL) 25 MG tablet Take 1 tablet (25 mg total) by mouth every 6 (six) hours as needed for itching. Patient not taking: Reported on 01/17/2017 10/07/16   Caccavale,  Sophia, PA-C  ondansetron (ZOFRAN ODT) 4 MG disintegrating tablet Take 1 tablet (4 mg total) by mouth every 6 (six) hours as needed for nausea or vomiting. 01/18/17   Ward, Layla MawKristen N, DO  pantoprazole (PROTONIX) 40 MG tablet Take 1 tablet (40 mg total) by mouth daily. 01/18/17   Ward, Layla MawKristen N, DO  predniSONE (DELTASONE) 20 MG tablet Take 3 tablets a day for the 1st 5 days Take 2 tablets a day for the next 5 days. Take 1 tablet a day for 3 days. Take 1/2 tablet daily for the last 4 days. Patient not taking: Reported on 01/17/2017 10/07/16   Caccavale, Sophia, PA-C  Triamcinolone Acetonide (TRIAMCINOLONE 0.1 % CREAM : EUCERIN) CREA Apply 1 application topically 2 (two) times daily. Do not apply to face. Patient not taking: Reported on 01/17/2017 01/29/14   Arthor CaptainHarris, Abigail, PA-C  triamcinolone ointment (KENALOG) 0.5 % Apply topically 2 (two) times daily. Patient not taking: Reported on 01/17/2017 01/29/14   Arthor CaptainHarris, Abigail, PA-C    Family History History reviewed. No pertinent family history.  Social History Social History   Tobacco Use  . Smoking status: Never Smoker  . Smokeless tobacco: Never Used  Substance Use Topics  . Alcohol use: No  . Drug use: No     Allergies   Patient has no known allergies.   Review of Systems Review of Systems  Genitourinary: Positive for pelvic pain.  All other systems reviewed and  are negative.    Physical Exam Updated Vital Signs BP (!) 163/97 (BP Location: Right Arm)   Pulse 82   Temp 97.6 F (36.4 C) (Oral)   Resp 18   Ht 5\' 4"  (1.626 m)   Wt 90.7 kg   SpO2 97%   BMI 34.33 kg/m   Physical Exam Vitals signs and nursing note reviewed.  Constitutional:      Appearance: Normal appearance. She is normal weight.  Cardiovascular:     Rate and Rhythm: Normal rate.  Pulmonary:     Effort: Pulmonary effort is normal.  Abdominal:     General: Abdomen is flat.  Genitourinary:    Comments: Bladder protruding.  Rectal wall intact, cervis  palpated in vault.   Musculoskeletal: Normal range of motion.  Neurological:     General: No focal deficit present.     Mental Status: She is alert.  Psychiatric:        Mood and Affect: Mood normal.      ED Treatments / Results  Labs (all labs ordered are listed, but only abnormal results are displayed) Labs Reviewed - No data to display  EKG None  Radiology No results found.  Procedures Procedures (including critical care time)  Medications Ordered in ED Medications - No data to display   Initial Impression / Assessment and Plan / ED Course  I have reviewed the triage vital signs and the nursing notes.  Pertinent labs & imaging results that were available during my care of the patient were reviewed by me and considered in my medical decision making (see chart for details).     Pt counseled on bladder prolapse/cystocele.   Pt given information on options.  (poise bladder tampoons...  [facility] as she has not had a pap in several years.  Pt given referral to urology   Final Clinical Impressions(s) / ED Diagnoses   Final diagnoses:  Cystocele with prolapse    ED Discharge Orders    None    An After Visit Summary was printed and given to the patient.    Yang AreasSofia, Michelle K, [place]PA-C 02/03/18 [id number]    Michelle MondaySchlossman, Erin, MD 02/03/18 [phone]

## 2018-02-03 NOTE — ED Notes (Signed)
Pt discharged from ED; instructions provided; Pt encouraged to return to ED if symptoms worsen and to f/u with PCP; Pt verbalized understanding of all instructions 

## 2018-02-14 ENCOUNTER — Ambulatory Visit (INDEPENDENT_AMBULATORY_CARE_PROVIDER_SITE_OTHER): Payer: Self-pay | Admitting: Obstetrics and Gynecology

## 2018-02-14 ENCOUNTER — Encounter: Payer: Self-pay | Admitting: Obstetrics and Gynecology

## 2018-02-14 VITALS — BP 144/93 | HR 107 | Ht 64.0 in | Wt 198.9 lb

## 2018-02-14 DIAGNOSIS — N814 Uterovaginal prolapse, unspecified: Secondary | ICD-10-CM

## 2018-02-14 DIAGNOSIS — Z01419 Encounter for gynecological examination (general) (routine) without abnormal findings: Secondary | ICD-10-CM

## 2018-02-14 DIAGNOSIS — Z124 Encounter for screening for malignant neoplasm of cervix: Secondary | ICD-10-CM

## 2018-02-14 DIAGNOSIS — Z1151 Encounter for screening for human papillomavirus (HPV): Secondary | ICD-10-CM

## 2018-02-14 DIAGNOSIS — Z113 Encounter for screening for infections with a predominantly sexual mode of transmission: Secondary | ICD-10-CM

## 2018-02-14 DIAGNOSIS — N813 Complete uterovaginal prolapse: Secondary | ICD-10-CM

## 2018-02-14 DIAGNOSIS — I10 Essential (primary) hypertension: Secondary | ICD-10-CM

## 2018-02-14 NOTE — Progress Notes (Signed)
Michelle Yang presents for ER F/U for possible cystocele. Pt was seen in ER with c/o "vaginal bulge". Pt has been present for several months but has become more noticeable over the last few weeks. C/O pelvic pressure, increased urinary freq 10-15 times during the day and 4-5 times during the night) Occ stress urinary IC. Does have to splint the bladder at times to help with urination LMP 1 yr ago, some menopausal Sx. Last pap unknown, no abnormal No H/O STD Not sexual active TSVD x 3 (6-7#) no assisted vaginal delivers   EAB x 2 Denies any bowel dysfunction Ankle surgery only  PE AF BP 144/93 Lungs clear    Heart RRR Abd soft + BS GU prolapse of bladder with cervix 2-3 cm passed hymenal ring at rest in supine position, easily reduced, pap smear obtained, uterus small no adnexal masses   A/P Uterine prolapse with cystocele  Pap smear and screening mammogram for HM completed today. To see PCP about elevated BP. Prolapse reviewed with pt. Management options reviewed. Surgerical correction advised. Will refer to UroGyn for further eval. F/U PRN

## 2018-02-14 NOTE — Patient Instructions (Signed)
Pelvic Organ Prolapse Pelvic organ prolapse is the stretching, bulging, or dropping of pelvic organs into an abnormal position. It happens when the muscles and tissues that surround and support pelvic structures become weak or stretched. Pelvic organ prolapse can involve the:  Vagina (vaginal prolapse).  Uterus (uterine prolapse).  Bladder (cystocele).  Rectum (rectocele).  Intestines (enterocele). When organs other than the vagina are involved, they often bulge into the vagina or protrude from the vagina, depending on how severe the prolapse is. What are the causes? This condition may be caused by:  Pregnancy, labor, and childbirth.  Past pelvic surgery.  Decreased production of the hormone estrogen associated with menopause.  Consistently lifting more than 50 lb (23 kg).  Obesity.  Long-term inability to pass stool (chronic constipation).  A cough that lasts a long time (chronic).  Buildup of fluid in the abdomen due to certain diseases and other conditions. What are the signs or symptoms? Symptoms of this condition include:  Passing a little urine (loss of bladder control) when you cough, sneeze, strain, and exercise (stress incontinence). This may be worse immediately after childbirth. It may gradually improve over time.  Feeling pressure in your pelvis or vagina. This pressure may increase when you cough or when you are passing stool.  A bulge that protrudes from the opening of your vagina.  Difficulty passing urine or stool.  Pain in your lower back.  Pain, discomfort, or disinterest in sex.  Repeated bladder infections (urinary tract infections).  Difficulty inserting a tampon. In some people, this condition causes no symptoms. How is this diagnosed? This condition may be diagnosed based on a vaginal and rectal exam. During the exam, you may be asked to cough and strain while you are lying down, sitting, and standing up. Your health care provider will  determine if other tests are required, such as bladder function tests. How is this treated? Treatment for this condition may depend on your symptoms. Treatment may include:  Lifestyle changes, such as changes to your diet.  Emptying your bladder at scheduled times (bladder training therapy). This can help reduce or avoid urinary incontinence.  Estrogen. Estrogen may help mild prolapse by increasing the strength and tone of pelvic floor muscles.  Kegel exercises. These may help mild cases of prolapse by strengthening and tightening the muscles of the pelvic floor.  A soft, flexible device that helps support the vaginal walls and keep pelvic organs in place (pessary). This is inserted into your vagina by your health care provider.  Surgery. This is often the only form of treatment for severe prolapse. Follow these instructions at home:  Avoid drinking beverages that contain caffeine or alcohol.  Increase your intake of high-fiber foods. This can help decrease constipation and straining during bowel movements.  Lose weight if recommended by your health care provider.  Wear a sanitary pad or adult diapers if you have urinary incontinence.  Avoid heavy lifting and straining with exercise and work. Do not hold your breath when you perform mild to moderate lifting and exercise activities. Limit your activities as directed by your health care provider.  Do Kegel exercises as directed by your health care provider. To do this: ? Squeeze your pelvic floor muscles tight. You should feel a tight lift in your rectal area and a tightness in your vaginal area. Keep your stomach, buttocks, and legs relaxed. ? Hold the muscles tight for up to 10 seconds. ? Relax your muscles. ? Repeat this exercise 50 times a day,   or as many times as told by your health care provider. Continue to do this exercise for at least 4-6 weeks, or for as long as told by your health care provider.  Take over-the-counter and  prescription medicines only as told by your health care provider.  If you have a pessary, take care of it as told by your health care provider.  Keep all follow-up visits as told by your health care provider. This is important. Contact a health care provider if you:  Have symptoms that interfere with your daily activities or sex life.  Need medicine to help with the discomfort.  Notice bleeding from your vagina that is not related to your period.  Have a fever.  Have pain or bleeding when you urinate.  Have bleeding when you pass stool.  Pass urine when you have sex.  Have chronic constipation.  Have a pessary that falls out.  Have bad smelling vaginal discharge.  Have an unusual, low pain in your abdomen. Summary  Pelvic organ prolapse is the stretching, bulging, or dropping of pelvic organs into an abnormal position. It happens when the muscles and tissues that surround and support pelvic structures become weak or stretched.  When organs other than the vagina are involved, they often bulge into the vagina or protrude from the vagina, depending on how severe the prolapse is.  In most cases, this condition needs to be treated only if it produces symptoms. Treatment may include lifestyle changes, estrogen, Kegel exercises, pessary insertion, or surgery.  Avoid heavy lifting and straining with exercise and work. Do not hold your breath when you perform mild to moderate lifting and exercise activities. Limit your activities as directed by your health care provider. This information is not intended to replace advice given to you by your health care provider. Make sure you discuss any questions you have with your health care provider. Document Released: 08/27/2013 Document Revised: 02/21/2017 Document Reviewed: 02/21/2017 Elsevier Interactive Patient Education  2019 [employer] About Cystocele  Overview  The pelvic organs, including the bladder, are normally supported by  pelvic floor muscles and ligaments.  When these muscles and ligaments are stretched, weakened or torn, the wall between the bladder and the vagina sags or herniates causing a prolapse, sometimes called a cystocele.  This condition may cause discomfort and problems with emptying the bladder.  It can be present in various stages.  Some people are not aware of the changes.  Others may notice changes at the vaginal opening or a feeling of the bladder dropping outside the body.  Causes of a Cystocele  A cystocele is usually caused by muscle straining or stretching during childbirth.  In addition, cystocele is more common after menopause, because the hormone estrogen helps keep the elastic tissues around the pelvic organs strong.  A cystocele is more likely to occur when levels of estrogen decrease.  Other causes include: heavy lifting, chronic coughing, previous pelvic surgery and obesity.  Symptoms  A bladder that has dropped from its normal position may cause: unwanted urine leakage (stress incontinence), frequent urination or urge to urinate, incomplete emptying of the bladder (not feeling bladder relief after emptying), pain or discomfort in the vagina, pelvis, groin, lower back or lower abdomen and frequent urinary tract infections.  Mild cases may not cause any symptoms.  Treatment Options  Pelvic floor (Kegel) exercises:  Strength training the muscles in your genital area  Behavioral changes: Treating and preventing constipation, taking time to empty your bladder properly, learning  to lift properly and/or avoid heavy lifting when possible, stopping smoking, avoiding weight gain and treating a chronic cough or bronchitis.  A pessary: A vaginal support device is sometimes used to help pelvic support caused by muscle and ligament changes.  Surgery: Surgical repair may be necessary if symptoms cannot be managed with exercise, behavioral changes and a pessary.  Surgery is usually considered for  severe cases.   2007, [employer]

## 2018-02-15 LAB — CYTOLOGY - PAP
Chlamydia: NEGATIVE
DIAGNOSIS: NEGATIVE
HPV: NOT DETECTED
Neisseria Gonorrhea: NEGATIVE

## 2018-02-15 NOTE — Addendum Note (Signed)
Addended by: Henrietta Dine on: 02/15/2018 09:50 AM   Modules accepted: Orders

## 2018-02-15 NOTE — Progress Notes (Addendum)
Pt is Self Pay will refer to [facility] for Mammogram.

## 2018-02-18 ENCOUNTER — Telehealth: Payer: Self-pay

## 2018-02-18 NOTE — Telephone Encounter (Signed)
-----   Message from Michael L Ervin, MD sent at 02/17/2018  3:31 PM EST ----- Please let pt know that her pap smear was normal. Thanks Michael 

## 2018-02-18 NOTE — Telephone Encounter (Signed)
Called pt regarding Pap results, No answer, only heard busy signal, was not able to leave a message.

## 2018-02-19 ENCOUNTER — Telehealth: Payer: Self-pay

## 2018-02-19 NOTE — Telephone Encounter (Signed)
2nd attempt to call pt and let her know of Pap test results, no answer but was able to leave a VM to call back to get results.

## 2018-02-19 NOTE — Telephone Encounter (Signed)
-----   Message from Hermina Staggers, MD sent at 02/17/2018  3:31 PM EST ----- Please let pt know that her pap smear was normal. Thanks Casimiro Needle

## 2018-02-20 ENCOUNTER — Telehealth: Payer: Self-pay

## 2018-02-20 NOTE — Telephone Encounter (Signed)
Notified pt that her pap smear results are normal.  Pt stated understanding.

## 2018-03-12 ENCOUNTER — Ambulatory Visit (INDEPENDENT_AMBULATORY_CARE_PROVIDER_SITE_OTHER): Payer: Self-pay | Admitting: Family Medicine

## 2018-06-26 ENCOUNTER — Other Ambulatory Visit: Payer: Self-pay

## 2018-06-26 ENCOUNTER — Ambulatory Visit (HOSPITAL_COMMUNITY)
Admission: EM | Admit: 2018-06-26 | Discharge: 2018-06-26 | Disposition: A | Discharge status: Home / Self Care | Payer: Self-pay | Attending: Family Medicine | Admitting: Family Medicine

## 2018-06-26 ENCOUNTER — Encounter (HOSPITAL_COMMUNITY): Payer: Self-pay

## 2018-06-26 DIAGNOSIS — R030 Elevated blood-pressure reading, without diagnosis of hypertension: Secondary | ICD-10-CM

## 2018-06-26 DIAGNOSIS — R21 Rash and other nonspecific skin eruption: Secondary | ICD-10-CM

## 2018-06-26 MED ORDER — DEXAMETHASONE SODIUM PHOSPHATE 10 MG/ML IJ SOLN
INTRAMUSCULAR | Status: AC
  Filled 2018-06-26: qty 1

## 2018-06-26 MED ORDER — DEXAMETHASONE SODIUM PHOSPHATE 10 MG/ML IJ SOLN
10.0000 mg | Freq: Once | INTRAMUSCULAR | Status: AC
  Administered 2018-06-26: 11:00:00 10 mg via INTRAMUSCULAR

## 2018-06-26 NOTE — ED Triage Notes (Addendum)
Pt cc she has a rash that's been there since 2 weeks. Pt states its spreading.  Pt states when the sun is out the rash gets worst. ( chest area mainly )

## 2018-06-26 NOTE — ED Provider Notes (Signed)
[facility]   [id number] 06/26/18 Arrival Time: [id number]  ASSESSMENT & PLAN:  1. Rash and nonspecific skin eruption   2. Elevated blood pressure reading without diagnosis of hypertension    Meds ordered this encounter  Medications  . dexamethasone (DECADRON) injection 10 mg   Has dermatology appt in a couple of months. May f/u here as needed. Unclear etiology of this long-standing problem. Discussed.  Reviewed expectations re: course of current medical issues. Questions answered. Outlined signs and symptoms indicating need for more acute intervention. Patient verbalized understanding. After Visit Summary given.   SUBJECTIVE:  Michelle Yang is a 53 y.o. female who presents with a skin complaint.   Location: sun-exposed arms and neck Onset: abrupt Duration: 2 weeks Associated pruritis? Mild    Associated pain? none Progression: stable  Drainage? No  Known trigger? No  New soaps/lotions/topicals/detergents/environmental exposures? No Contacts with similar? No Recent travel? No  Other associated symptoms: none Therapies tried thus far: none Arthralgia or myalgia? none Recent illness? none Fever? none No specific aggravating or alleviating factors reported. H/O similar.  Increased blood pressure noted today. Reports that she has not been treated for hypertension in the past.  She reports no chest pain on exertion, no dyspnea on exertion, no swelling of ankles, no orthostatic dizziness or lightheadedness, no orthopnea or paroxysmal nocturnal dyspnea, no palpitations and no intermittent claudication symptoms. Planning on establishing care with PCP when she gets insurance; "should be soon".  ROS: As per HPI.  OBJECTIVE: Vitals:   06/26/18 1004 06/26/18 1007  BP: (!) 174/118   Pulse: 91   Resp: 18   Temp: 98.1 F (36.7 C)   TempSrc: Oral   SpO2: 97%   Weight:  90.7 kg    General appearance: alert; no distress Lungs: clear to auscultation  bilaterally Heart: regular rate and rhythm Extremities: no edema Skin: warm and dry; skin irritation on sun-exposed areas of arms and neck; no sign of infection Psychological: alert and cooperative; normal mood and affect  No Known Allergies  Past Medical History:  Diagnosis Date  . Medical history non-contributory    Social History   Socioeconomic History  . Marital status: Single    Spouse name: Not on file  . Number of children: Not on file  . Years of education: Not on file  . Highest education level: Not on file  Occupational History  . Not on file  Social Needs  . Financial resource strain: Not on file  . Food insecurity:    Worry: Not on file    Inability: Not on file  . Transportation needs:    Medical: Not on file    Non-medical: Not on file  Tobacco Use  . Smoking status: Never Smoker  . Smokeless tobacco: Never Used  Substance and Sexual Activity  . Alcohol use: No  . Drug use: No  . Sexual activity: Never    Birth control/protection: None  Lifestyle  . Physical activity:    Days per week: Not on file    Minutes per session: Not on file  . Stress: Not on file  Relationships  . Social connections:    Talks on phone: Not on file    Gets together: Not on file    Attends religious service: Not on file    Active member of club or organization: Not on file    Attends meetings of clubs or organizations: Not on file    Relationship status: Not on file  . Intimate  partner violence:    Fear of current or ex partner: Not on file    Emotionally abused: Not on file    Physically abused: Not on file    Forced sexual activity: Not on file  Other Topics Concern  . Not on file  Social History Narrative  . Not on file   History reviewed. No pertinent family history. Past Surgical History:  Procedure Laterality Date  . ANKLE FRACTURE SURGERY Left      Mardella Layman, MD 07/11/18 1009

## 2018-07-17 ENCOUNTER — Ambulatory Visit (INDEPENDENT_AMBULATORY_CARE_PROVIDER_SITE_OTHER): Payer: Self-pay | Admitting: Primary Care

## 2019-11-17 ENCOUNTER — Observation Stay (HOSPITAL_BASED_OUTPATIENT_CLINIC_OR_DEPARTMENT_OTHER)
Admission: EM | Admit: 2019-11-17 | Discharge: 2019-11-18 | Disposition: A | Discharge status: Home / Self Care | Payer: Self-pay | Attending: Internal Medicine | Admitting: Internal Medicine

## 2019-11-17 ENCOUNTER — Observation Stay (HOSPITAL_COMMUNITY): Payer: Self-pay

## 2019-11-17 ENCOUNTER — Other Ambulatory Visit: Payer: Self-pay

## 2019-11-17 ENCOUNTER — Emergency Department (HOSPITAL_BASED_OUTPATIENT_CLINIC_OR_DEPARTMENT_OTHER): Payer: Self-pay

## 2019-11-17 ENCOUNTER — Encounter (HOSPITAL_BASED_OUTPATIENT_CLINIC_OR_DEPARTMENT_OTHER): Payer: Self-pay

## 2019-11-17 DIAGNOSIS — Z20822 Contact with and (suspected) exposure to covid-19: Secondary | ICD-10-CM | POA: Insufficient documentation

## 2019-11-17 DIAGNOSIS — Z7982 Long term (current) use of aspirin: Secondary | ICD-10-CM | POA: Insufficient documentation

## 2019-11-17 DIAGNOSIS — R030 Elevated blood-pressure reading, without diagnosis of hypertension: Secondary | ICD-10-CM | POA: Diagnosis present

## 2019-11-17 DIAGNOSIS — R200 Anesthesia of skin: Secondary | ICD-10-CM

## 2019-11-17 DIAGNOSIS — Z79899 Other long term (current) drug therapy: Secondary | ICD-10-CM | POA: Insufficient documentation

## 2019-11-17 DIAGNOSIS — R7989 Other specified abnormal findings of blood chemistry: Secondary | ICD-10-CM | POA: Insufficient documentation

## 2019-11-17 DIAGNOSIS — G459 Transient cerebral ischemic attack, unspecified: Secondary | ICD-10-CM

## 2019-11-17 DIAGNOSIS — R202 Paresthesia of skin: Secondary | ICD-10-CM | POA: Insufficient documentation

## 2019-11-17 DIAGNOSIS — G5682 Other specified mononeuropathies of left upper limb: Secondary | ICD-10-CM | POA: Insufficient documentation

## 2019-11-17 DIAGNOSIS — R7309 Other abnormal glucose: Secondary | ICD-10-CM | POA: Insufficient documentation

## 2019-11-17 DIAGNOSIS — I10 Essential (primary) hypertension: Secondary | ICD-10-CM

## 2019-11-17 DIAGNOSIS — R531 Weakness: Principal | ICD-10-CM

## 2019-11-17 LAB — RAPID URINE DRUG SCREEN, HOSP PERFORMED
Amphetamines: NOT DETECTED
Barbiturates: NOT DETECTED
Benzodiazepines: NOT DETECTED
Cocaine: NOT DETECTED
Opiates: NOT DETECTED
Tetrahydrocannabinol: NOT DETECTED

## 2019-11-17 LAB — RETICULOCYTES
Immature Retic Fract: 13.1 % (ref 2.3–15.9)
RBC.: 5.25 MIL/uL — ABNORMAL HIGH (ref 3.87–5.11)
Retic Count, Absolute: 69.8 10*3/uL (ref 19.0–186.0)
Retic Ct Pct: 1.3 % (ref 0.4–3.1)

## 2019-11-17 LAB — LIPID PANEL
Cholesterol: 222 mg/dL — ABNORMAL HIGH (ref 0–200)
HDL: 50 mg/dL (ref >40)
LDL Cholesterol: 153 mg/dL — ABNORMAL HIGH (ref 0–99)
Total CHOL/HDL Ratio: 4.4 RATIO
Triglycerides: 96 mg/dL (ref <150)
VLDL: 19 mg/dL (ref 0–40)

## 2019-11-17 LAB — HEMOGLOBIN A1C
Hgb A1c MFr Bld: 6.3 % — ABNORMAL HIGH (ref 4.8–5.6)
Mean Plasma Glucose: 134.11 mg/dL

## 2019-11-17 LAB — DIFFERENTIAL
Abs Immature Granulocytes: 0.02 10*3/uL (ref 0.00–0.07)
Basophils Absolute: 0 10*3/uL (ref 0.0–0.1)
Basophils Relative: 1 %
Eosinophils Absolute: 0.2 10*3/uL (ref 0.0–0.5)
Eosinophils Relative: 3 %
Immature Granulocytes: 0 %
Lymphocytes Relative: 30 %
Lymphs Abs: 1.6 10*3/uL (ref 0.7–4.0)
Monocytes Absolute: 0.3 10*3/uL (ref 0.1–1.0)
Monocytes Relative: 6 %
Neutro Abs: 3.2 10*3/uL (ref 1.7–7.7)
Neutrophils Relative %: 60 %

## 2019-11-17 LAB — RESPIRATORY PANEL BY RT PCR (FLU A&B, COVID)
Influenza A by PCR: NEGATIVE
Influenza B by PCR: NEGATIVE
SARS Coronavirus 2 by RT PCR: NEGATIVE

## 2019-11-17 LAB — CBC
HCT: 43.9 % (ref 36.0–46.0)
Hemoglobin: 14 g/dL (ref 12.0–15.0)
MCH: 26.6 pg (ref 26.0–34.0)
MCHC: 31.9 g/dL (ref 30.0–36.0)
MCV: 83.3 fL (ref 80.0–100.0)
Platelets: 314 10*3/uL (ref 150–400)
RBC: 5.27 MIL/uL — ABNORMAL HIGH (ref 3.87–5.11)
RDW: 14.5 % (ref 11.5–15.5)
WBC: 5.3 10*3/uL (ref 4.0–10.5)
nRBC: 0 % (ref 0.0–0.2)

## 2019-11-17 LAB — PREGNANCY, URINE: Preg Test, Ur: NEGATIVE

## 2019-11-17 LAB — CBG MONITORING, ED: Glucose-Capillary: 151 mg/dL — ABNORMAL HIGH (ref 70–99)

## 2019-11-17 LAB — COMPREHENSIVE METABOLIC PANEL
ALT: 20 U/L (ref 0–44)
AST: 20 U/L (ref 15–41)
Albumin: 4 g/dL (ref 3.5–5.0)
Alkaline Phosphatase: 71 U/L (ref 38–126)
Anion gap: 12 (ref 5–15)
BUN: 9 mg/dL (ref 6–20)
CO2: 27 mmol/L (ref 22–32)
Calcium: 9.5 mg/dL (ref 8.9–10.3)
Chloride: 102 mmol/L (ref 98–111)
Creatinine, Ser: 1.11 mg/dL — ABNORMAL HIGH (ref 0.44–1.00)
GFR calc Af Amer: >60 mL/min (ref >60)
GFR calc non Af Amer: 57 mL/min — ABNORMAL LOW (ref >60)
Glucose, Bld: 158 mg/dL — ABNORMAL HIGH (ref 70–99)
Potassium: 4 mmol/L (ref 3.5–5.1)
Sodium: 141 mmol/L (ref 135–145)
Total Bilirubin: 0.2 mg/dL — ABNORMAL LOW (ref 0.3–1.2)
Total Protein: 7.2 g/dL (ref 6.5–8.1)

## 2019-11-17 LAB — URINALYSIS, ROUTINE W REFLEX MICROSCOPIC
Bilirubin Urine: NEGATIVE
Glucose, UA: NEGATIVE mg/dL
Hgb urine dipstick: NEGATIVE
Ketones, ur: NEGATIVE mg/dL
Leukocytes,Ua: NEGATIVE
Nitrite: NEGATIVE
Protein, ur: NEGATIVE mg/dL
Specific Gravity, Urine: 1.01 (ref 1.005–1.030)
pH: 7 (ref 5.0–8.0)

## 2019-11-17 LAB — IRON AND TIBC
Iron: 61 ug/dL (ref 28–170)
Saturation Ratios: 15 % (ref 10.4–31.8)
TIBC: 400 ug/dL (ref 250–450)
UIBC: 339 ug/dL

## 2019-11-17 LAB — TYPE AND SCREEN
ABO/RH(D): O NEG
Antibody Screen: NEGATIVE

## 2019-11-17 LAB — ETHANOL: Alcohol, Ethyl (B): <10 mg/dL (ref <10)

## 2019-11-17 LAB — TROPONIN I (HIGH SENSITIVITY)
Troponin I (High Sensitivity): 3 ng/L (ref <18)
Troponin I (High Sensitivity): 2 ng/L (ref <18)

## 2019-11-17 LAB — FOLATE: Folate: 7.4 ng/mL (ref >5.9)

## 2019-11-17 LAB — TSH: TSH: 0.601 u[IU]/mL (ref 0.350–4.500)

## 2019-11-17 LAB — PROTIME-INR
INR: 1 (ref 0.8–1.2)
Prothrombin Time: 13 seconds (ref 11.4–15.2)

## 2019-11-17 LAB — HIV ANTIBODY (ROUTINE TESTING W REFLEX): HIV Screen 4th Generation wRfx: NONREACTIVE

## 2019-11-17 LAB — VITAMIN B12: Vitamin B-12: 856 pg/mL (ref 180–914)

## 2019-11-17 LAB — FERRITIN: Ferritin: 73 ng/mL (ref 11–307)

## 2019-11-17 LAB — T4, FREE: Free T4: 0.91 ng/dL (ref 0.61–1.12)

## 2019-11-17 LAB — ABO/RH: ABO/RH(D): O NEG

## 2019-11-17 LAB — APTT: aPTT: 32 seconds (ref 24–36)

## 2019-11-17 IMAGING — CT CT ANGIO HEAD
1 of 8 series · 7 of 33 positions shown · IV contrast (Omnipaque)
Comparison: Report of brain MRI [DATE] (no images available).

CLINICAL DATA: 53-year-old female woke at [7X] hours with right
side numbness and weakness. Nausea, chest tightness.

EXAM:
CT ANGIOGRAPHY HEAD AND NECK
TECHNIQUE: Multidetector CT imaging of the head and neck was performed using
the standard protocol during bolus administration of intravenous
contrast. Multiplanar CT image reconstructions and MIPs were
obtained to evaluate the vascular anatomy. Carotid stenosis
measurements (when applicable) are obtained utilizing NASCET
criteria, using the distal internal carotid diameter as the
denominator.
CONTRAST:  100mL OMNIPAQUE IOHEXOL 350 MG/ML SOLN

[Series 12: axial thin · axial · 0.39mm/px · z∈[-358,-100]mm · 7 of 346 slices shown]
[im 44/346  soft-tissue]
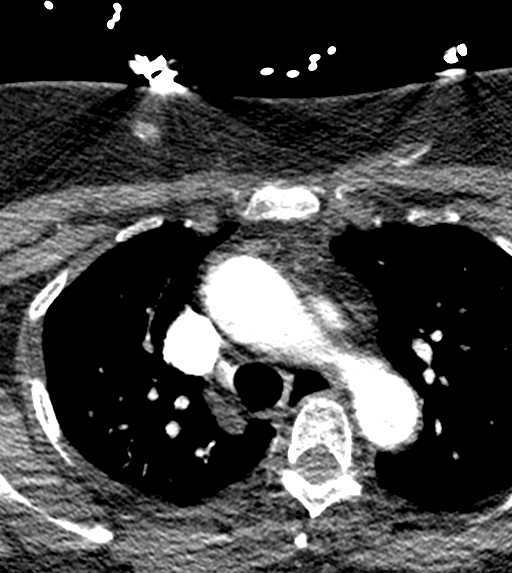
[im 87/346  bone]
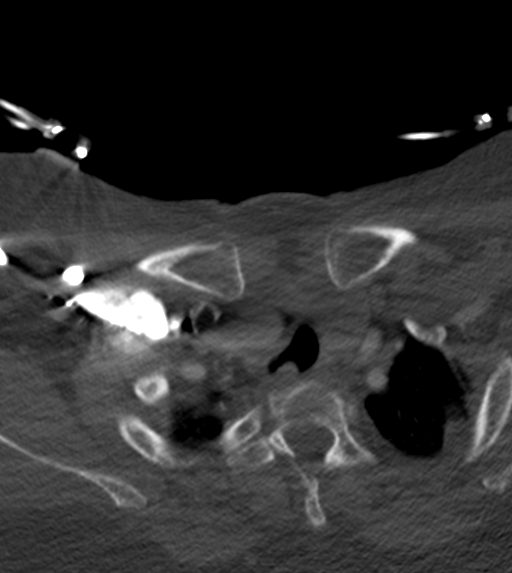
[im 130/346  soft-tissue]
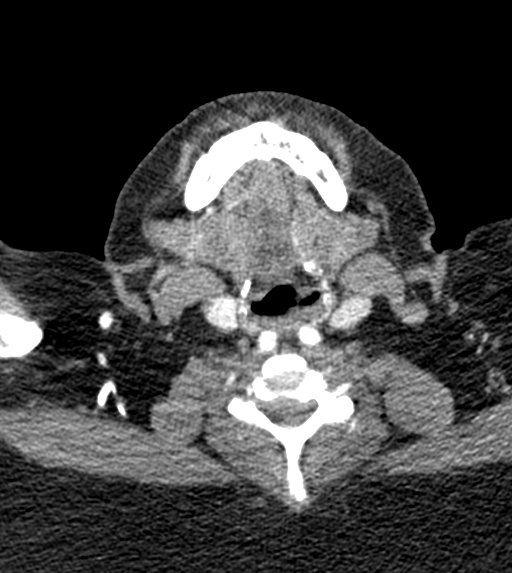
[im 173/346  bone]
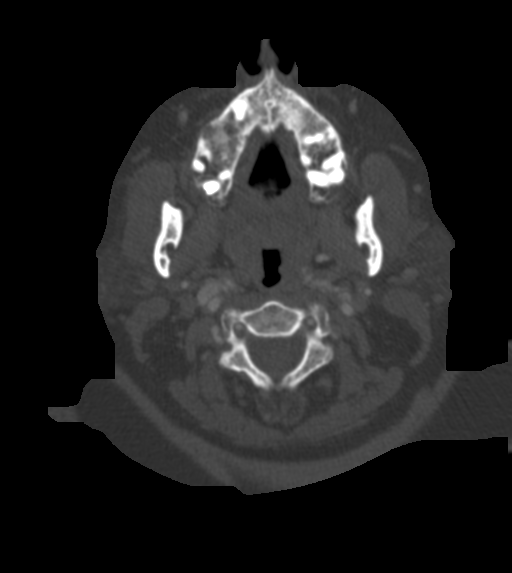
[im 216/346  soft-tissue]
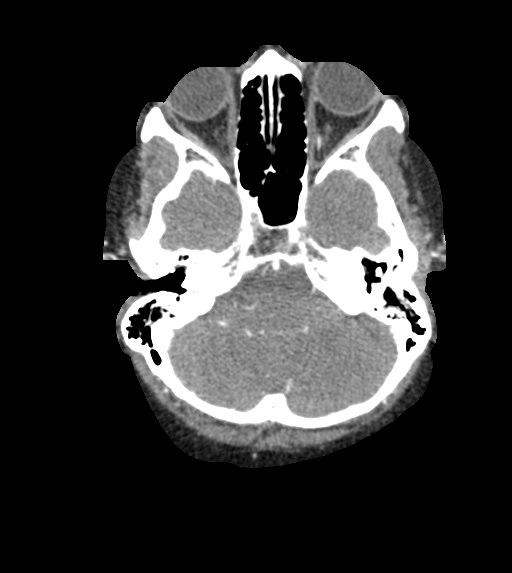
[im 259/346  bone]
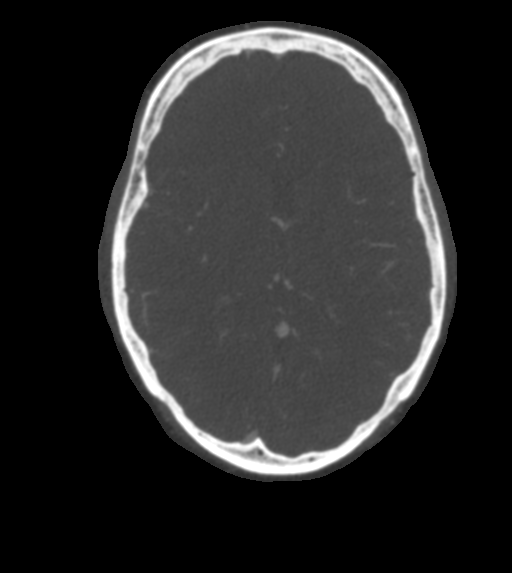
[im 302/346  soft-tissue]
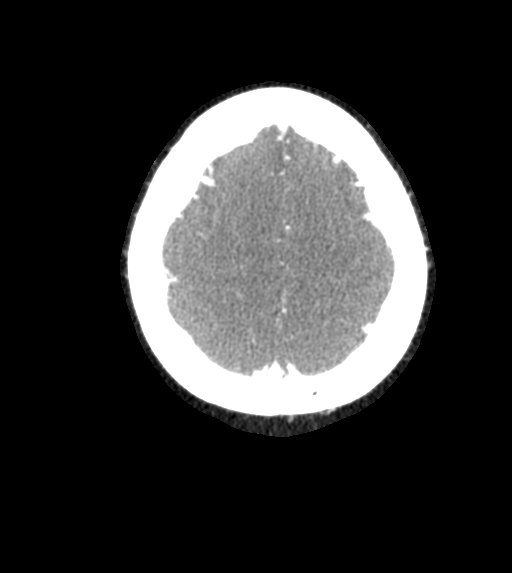

[7 of 33 positions shown; findings below may reference images not displayed]

FINDINGS: CT HEAD

Brain: Normal cerebral volume. No midline shift, ventriculomegaly,
mass effect, evidence of mass lesion, intracranial hemorrhage or
evidence of cortically based acute infarction. Gray-white matter
differentiation is within normal limits throughout the brain.

Calvarium and skull base: Negative.

Paranasal sinuses: Visualized paranasal sinuses and mastoids are
clear.

Orbits: Visualized orbits and scalp soft tissues are within normal
limits.

CTA NECK

Skeleton: Carious dentition. Partially visible thoracic scoliosis.
No acute osseous abnormality identified.

Upper chest: Negative.

Other neck: Generalized thyroid enlargement, with a 12 mm right lobe
nodule which does not meet criteria for follow-up. No follow-up
indicated. (Ref: [HOSPITAL]. [DATE]): 143-50).

Partially retropharyngeal course of both carotids. Otherwise
negative neck soft tissues.

Aortic arch: 3 vessel arch configuration.  No arch atherosclerosis.

Right carotid system: Tortuous brachiocephalic artery and proximal
right CCA without plaque or stenosis. Retropharyngeal carotid
bifurcation without plaque or stenosis.

Left carotid system: Tortuous proximal left CCA and retropharyngeal
left carotid bifurcation. No plaque or stenosis.

Vertebral arteries:
Tortuous but otherwise normal proximal right subclavian artery with
normal right vertebral artery origin. However, the right vertebral
is diminutive throughout the neck. It remains patent to the skull
base with no plaque or stenosis identified.

Minimal plaque in the proximal left subclavian artery without
stenosis. Diminutive left vertebral artery, also patent to the skull
base with no plaque or stenosis identified.

CTA HEAD

Posterior circulation: Patent but diminutive distal vertebral
arteries, vertebrobasilar junction and basilar artery. Patent PICA
origins, AICA and SCA origins. Fetal type bilateral PCA origins. A
small left P1 segment is present. Bilateral PCA branches are within
normal limits.

Anterior circulation: Both ICA siphons are patent. There is mild
calcified siphon plaque on the left but no associated stenosis.
Bilateral ophthalmic and posterior communicating artery origins are
normal. Patent carotid termini. Normal MCA and ACA origins. Mildly
tortuous A1 segments. Diminutive or absent anterior communicating
artery. Bilateral ACA branches are within normal limits.

Left MCA M1 segment and left MCA trifurcation is patent without
stenosis. Left MCA branches are within normal limits. Right MCA M1
segment and right MCA bifurcation are patent without stenosis. Right
MCA branches are within normal limits.

Venous sinuses: Patent.

Anatomic variants: Fetal type bilateral PCA origins with diminutive
vertebrobasilar system. Retropharyngeal carotids.

Review of the MIP images confirms the above findings
IMPRESSION: 1. Negative for large vessel occlusion.
2. Normal CT appearance of the brain.
3. Minimal atherosclerosis, involving the Left ICA siphon. No
arterial stenosis identified in the head or neck.
4. Carious dentition.  Thyroid goiter.

## 2019-11-17 IMAGING — MR MR HEAD W/O CM
7 of 11 series · 26 of 48 positions shown · non-contrast
Comparison: CT studies same day.

CLINICAL DATA: Acute presentation with right-sided weakness.
Negative acute CT evaluation.

EXAM:
MRI HEAD WITHOUT CONTRAST
TECHNIQUE: Multiplanar, multiecho pulse sequences of the brain and surrounding
structures were obtained without intravenous contrast.

[Series 2: DWI · axial · 3.0mm · 0.94mm/px · z∈[-124,+13]mm · 7 of 100 slices shown (1 of 2)]
[im 1/100]
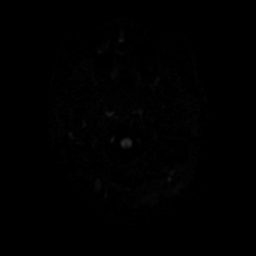
[im 17/100]
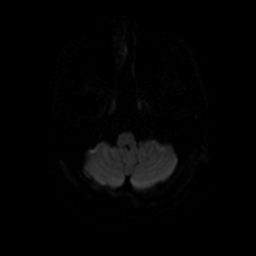
[im 34/100]
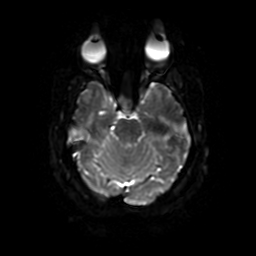
[im 50/100]
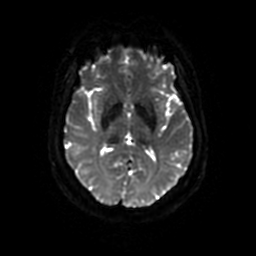
[im 67/100]
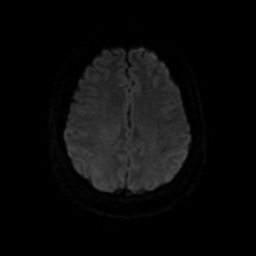
[im 83/100]
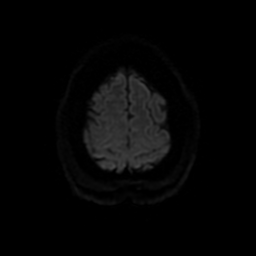
[im 100/100]
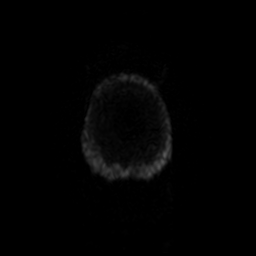

[Series 3: DWI · coronal · 4.0mm · 0.94mm/px · 6 of 72 slices shown (2 of 2)]
[im 1/72]
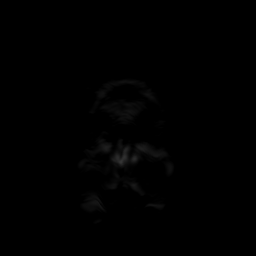
[im 15/72]
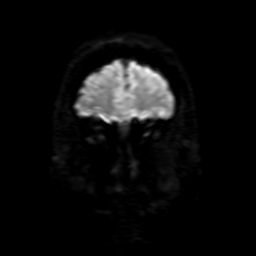
[im 29/72]
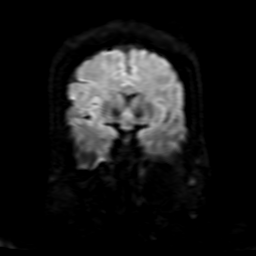
[im 43/72]
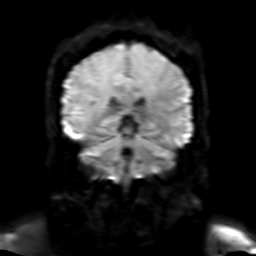
[im 57/72]
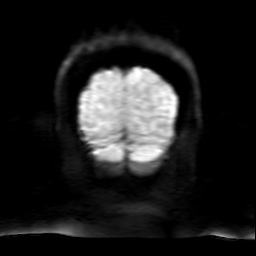
[im 72/72]
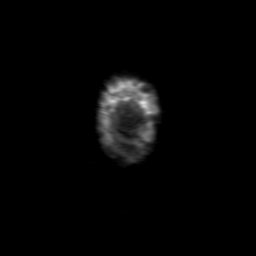

[Series 4: FLAIR · sagittal · 5.0mm · 0.23mm/px · 2 of 25 slices shown (1 of 2)]
[im 1/25]
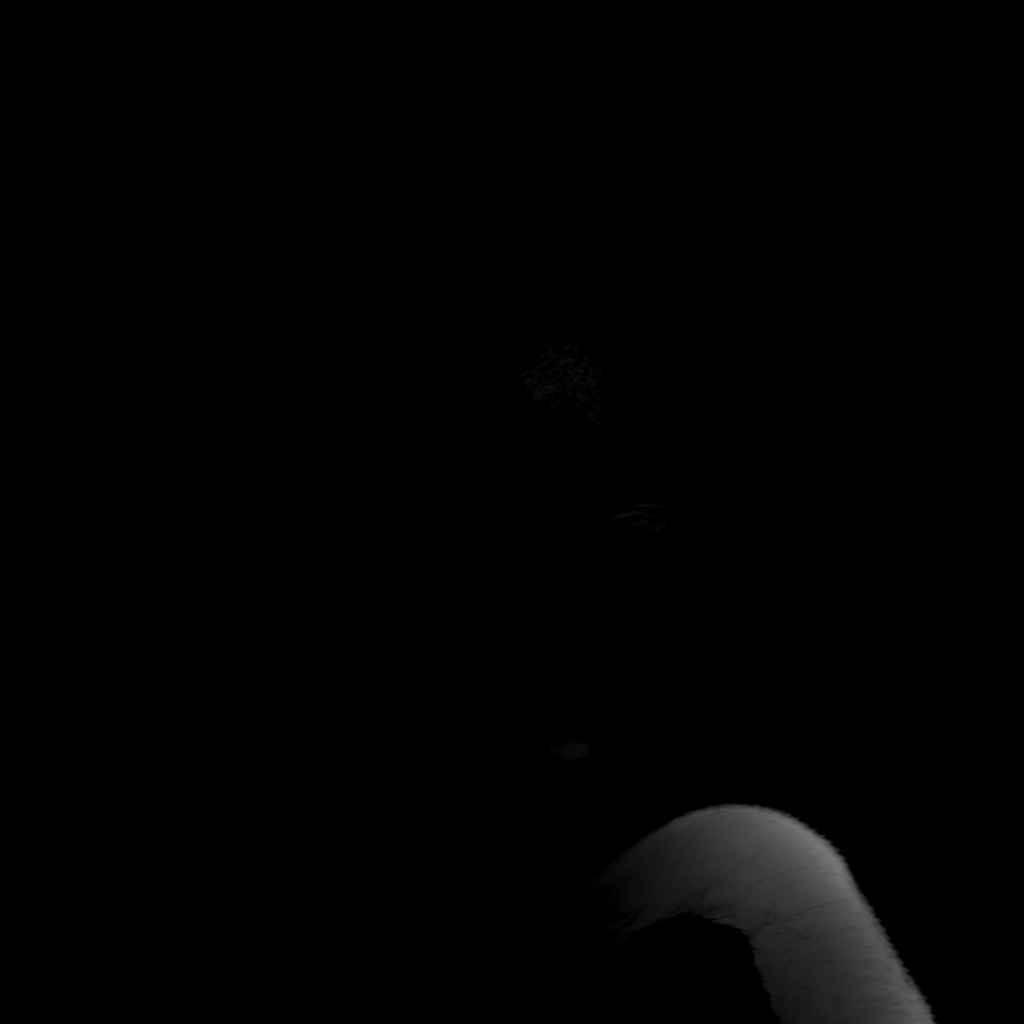
[im 25/25]
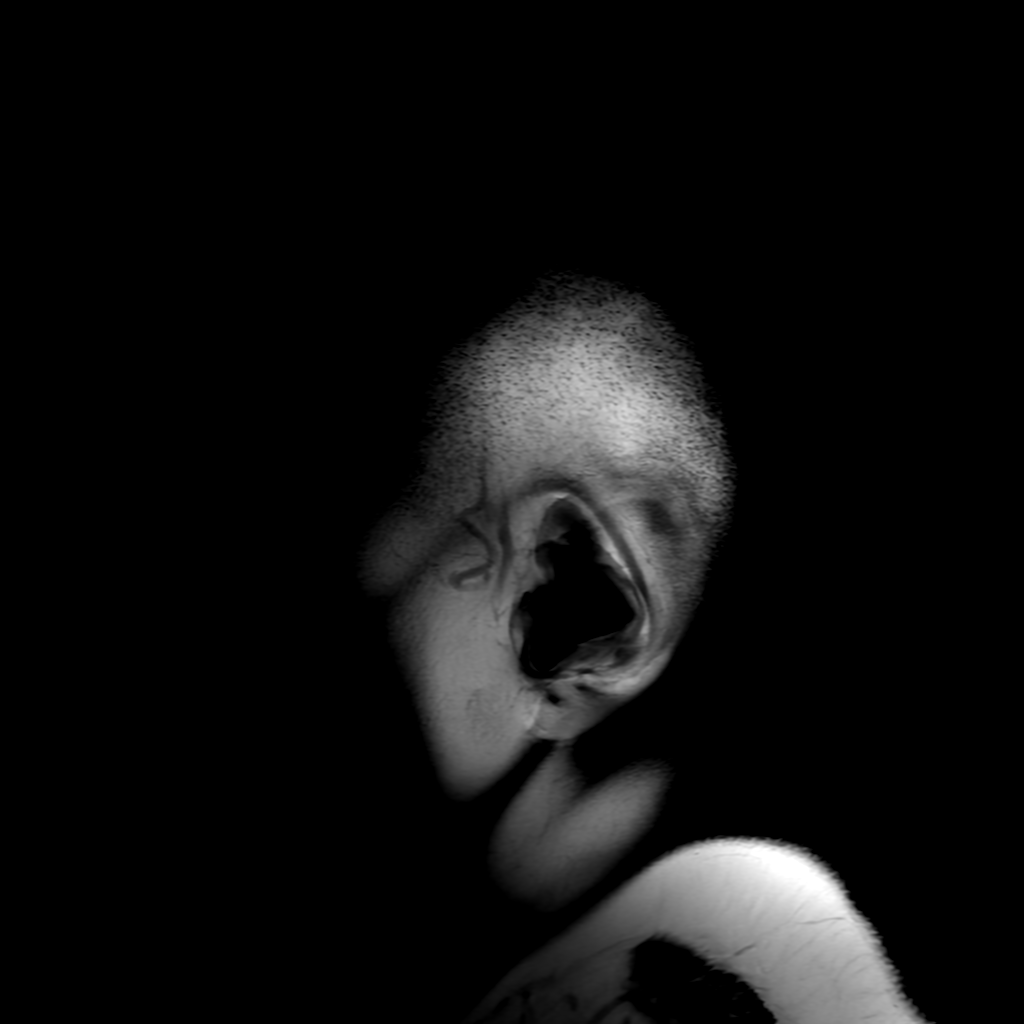

[Series 5: T2 · axial · 5.0mm · 0.47mm/px · z∈[-116,+30]mm · 2 of 26 slices shown]
[im 1/26]
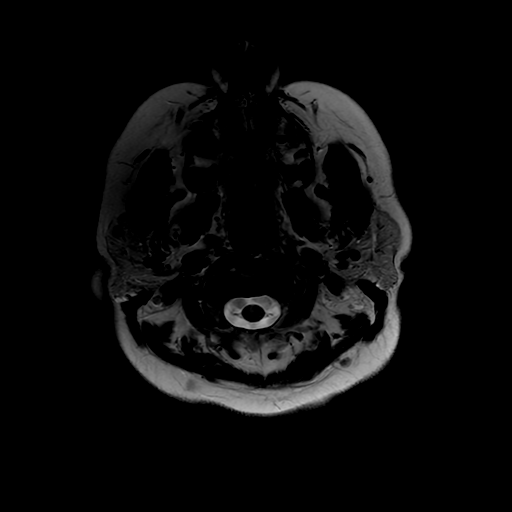
[im 26/26]
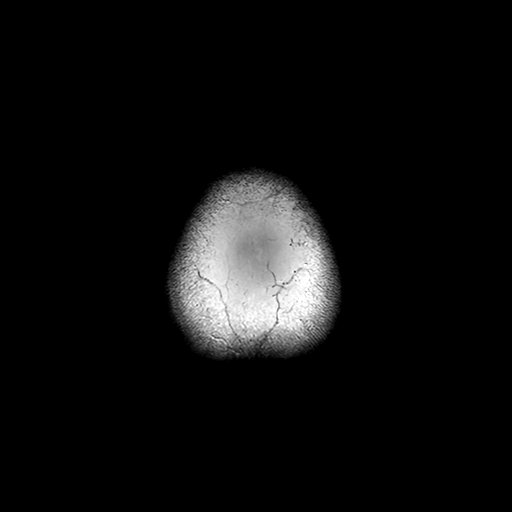

[Series 6: FLAIR · axial · 3.0mm · 0.41mm/px · z∈[-106,+34]mm · 2 of 25 slices shown (2 of 2)]
[im 1/25]
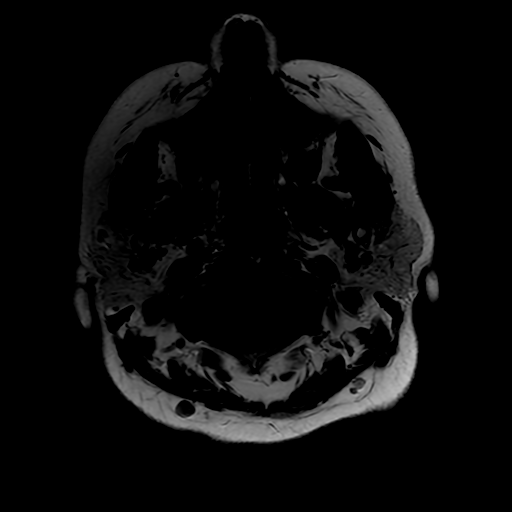
[im 25/25]
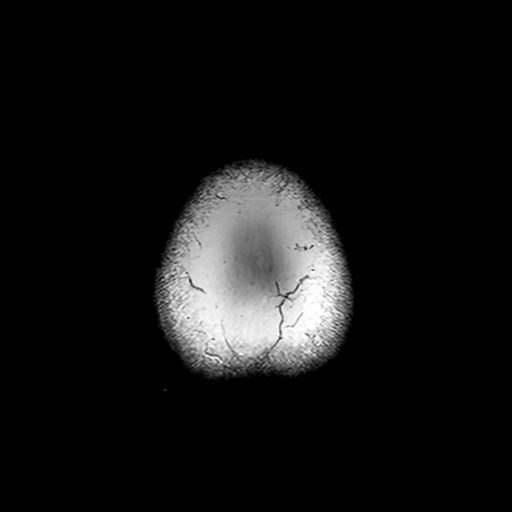

[Series 250: ADC · axial · 3.0mm · 0.94mm/px · z∈[-124,+13]mm · 4 of 50 slices shown (1 of 2)]
[im 1/50]
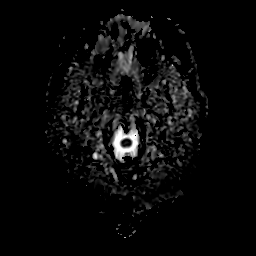
[im 17/50]
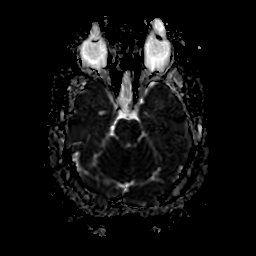
[im 33/50]
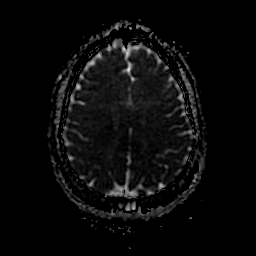
[im 50/50]
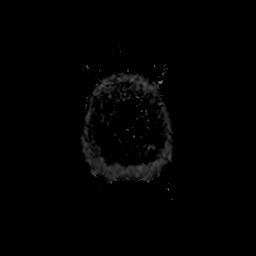

[Series 350: ADC · coronal · 4.0mm · 0.94mm/px · 3 of 36 slices shown (2 of 2)]
[im 1/36]
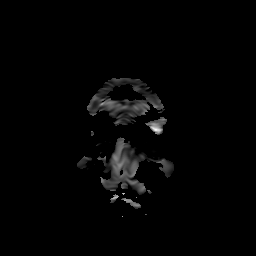
[im 18/36]
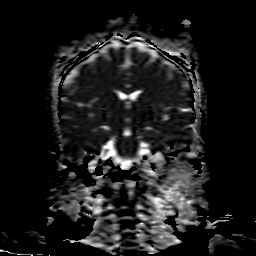
[im 36/36]
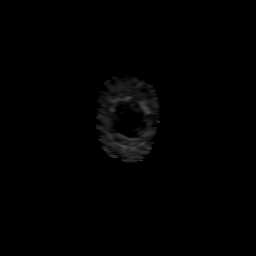

[26 of 48 positions shown; findings below may reference images not displayed]

FINDINGS: Brain: The brain has a normal appearance without evidence of
malformation, atrophy, old or acute small or large vessel
infarction, mass lesion, hemorrhage, hydrocephalus or extra-axial
collection.

Vascular: Major vessels at the base of the brain show flow. Venous
sinuses appear patent.

Skull and upper cervical spine: Normal.

Sinuses/Orbits: Clear/normal.

Other: None significant.
IMPRESSION: Normal examination.  No cause of right-sided weakness identified.

## 2019-11-17 IMAGING — DX DG CHEST 2V
2 series · 2 of 2 positions shown · non-contrast
Comparison: Chest radiograph [DATE]

CLINICAL DATA: Hypertension.  Right-sided numbness, now resolved.

EXAM:
CHEST - 2 VIEW

[chest pa]
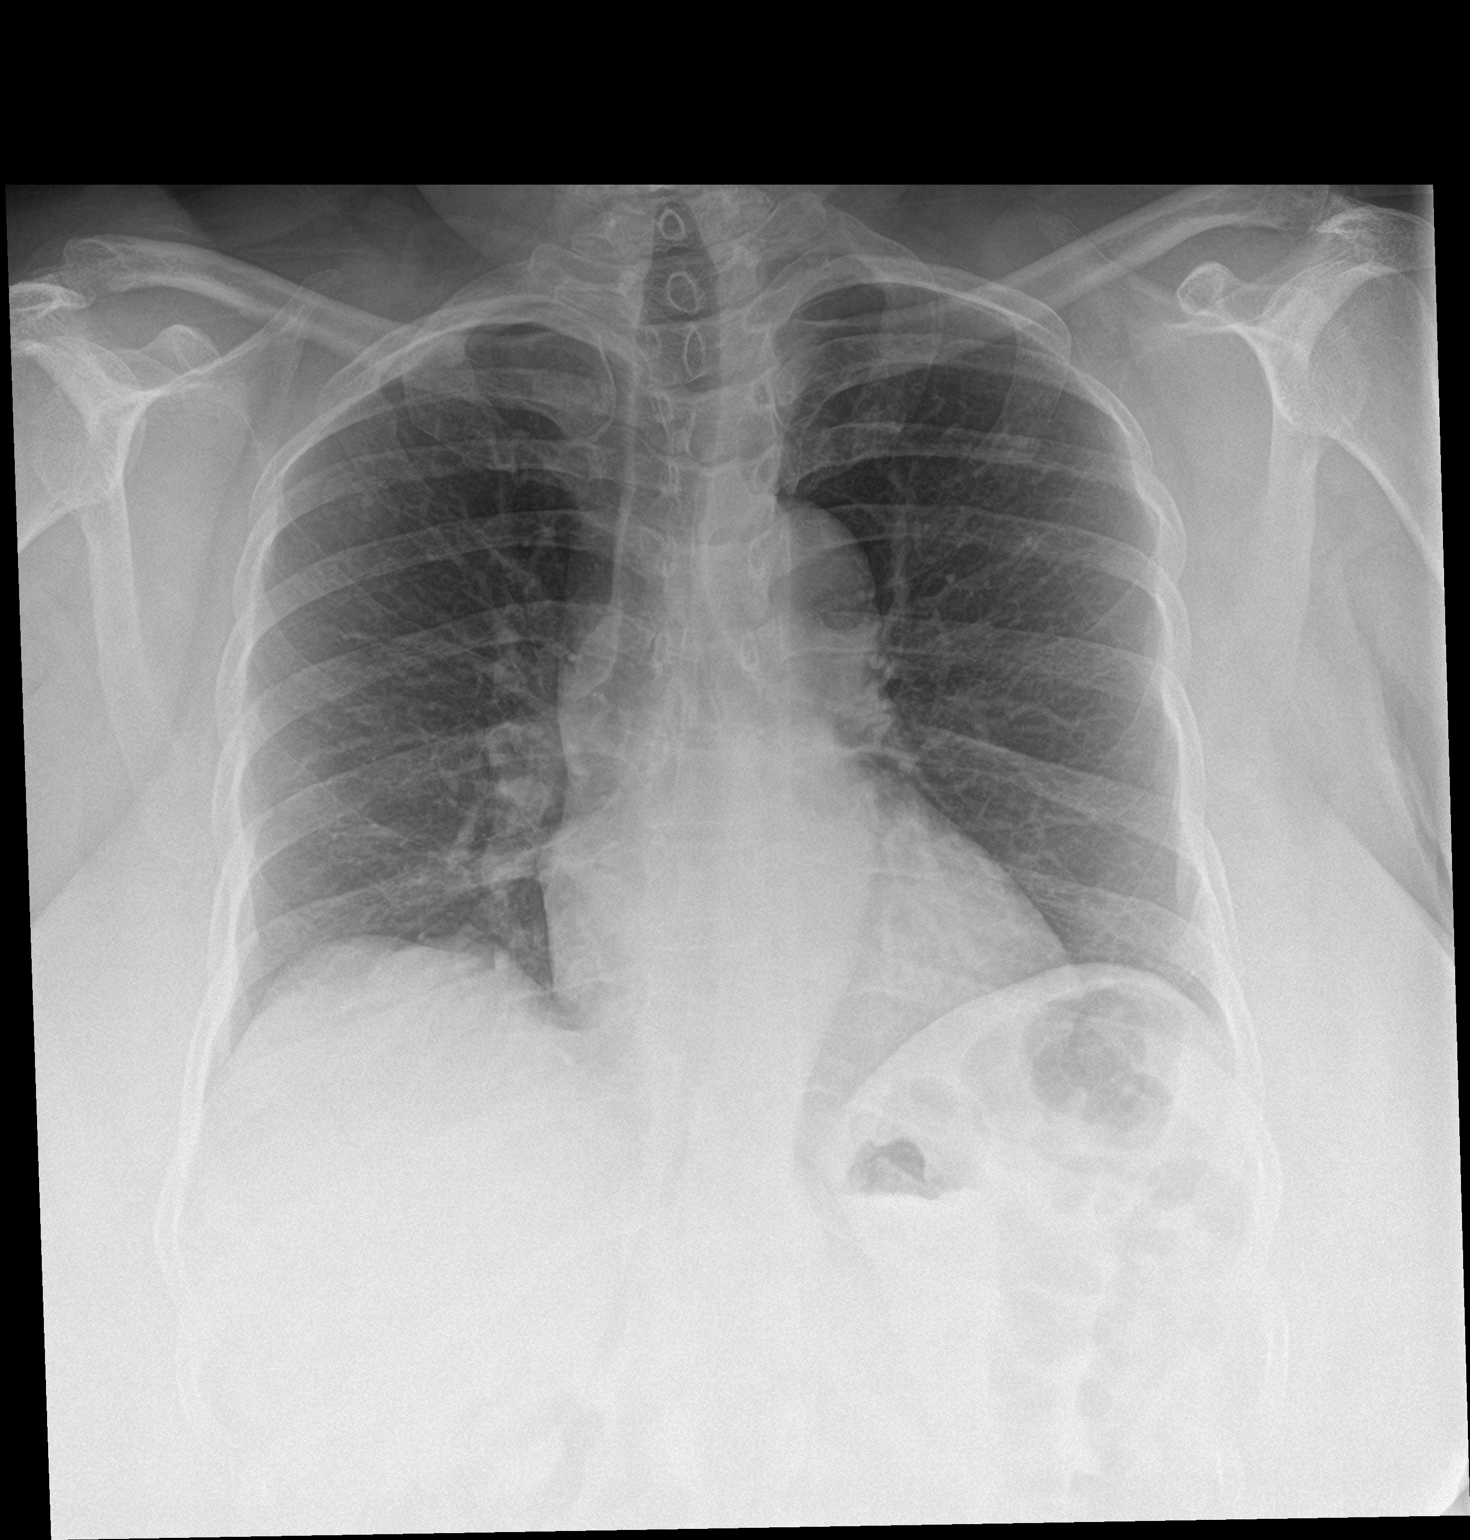

[chest lat]
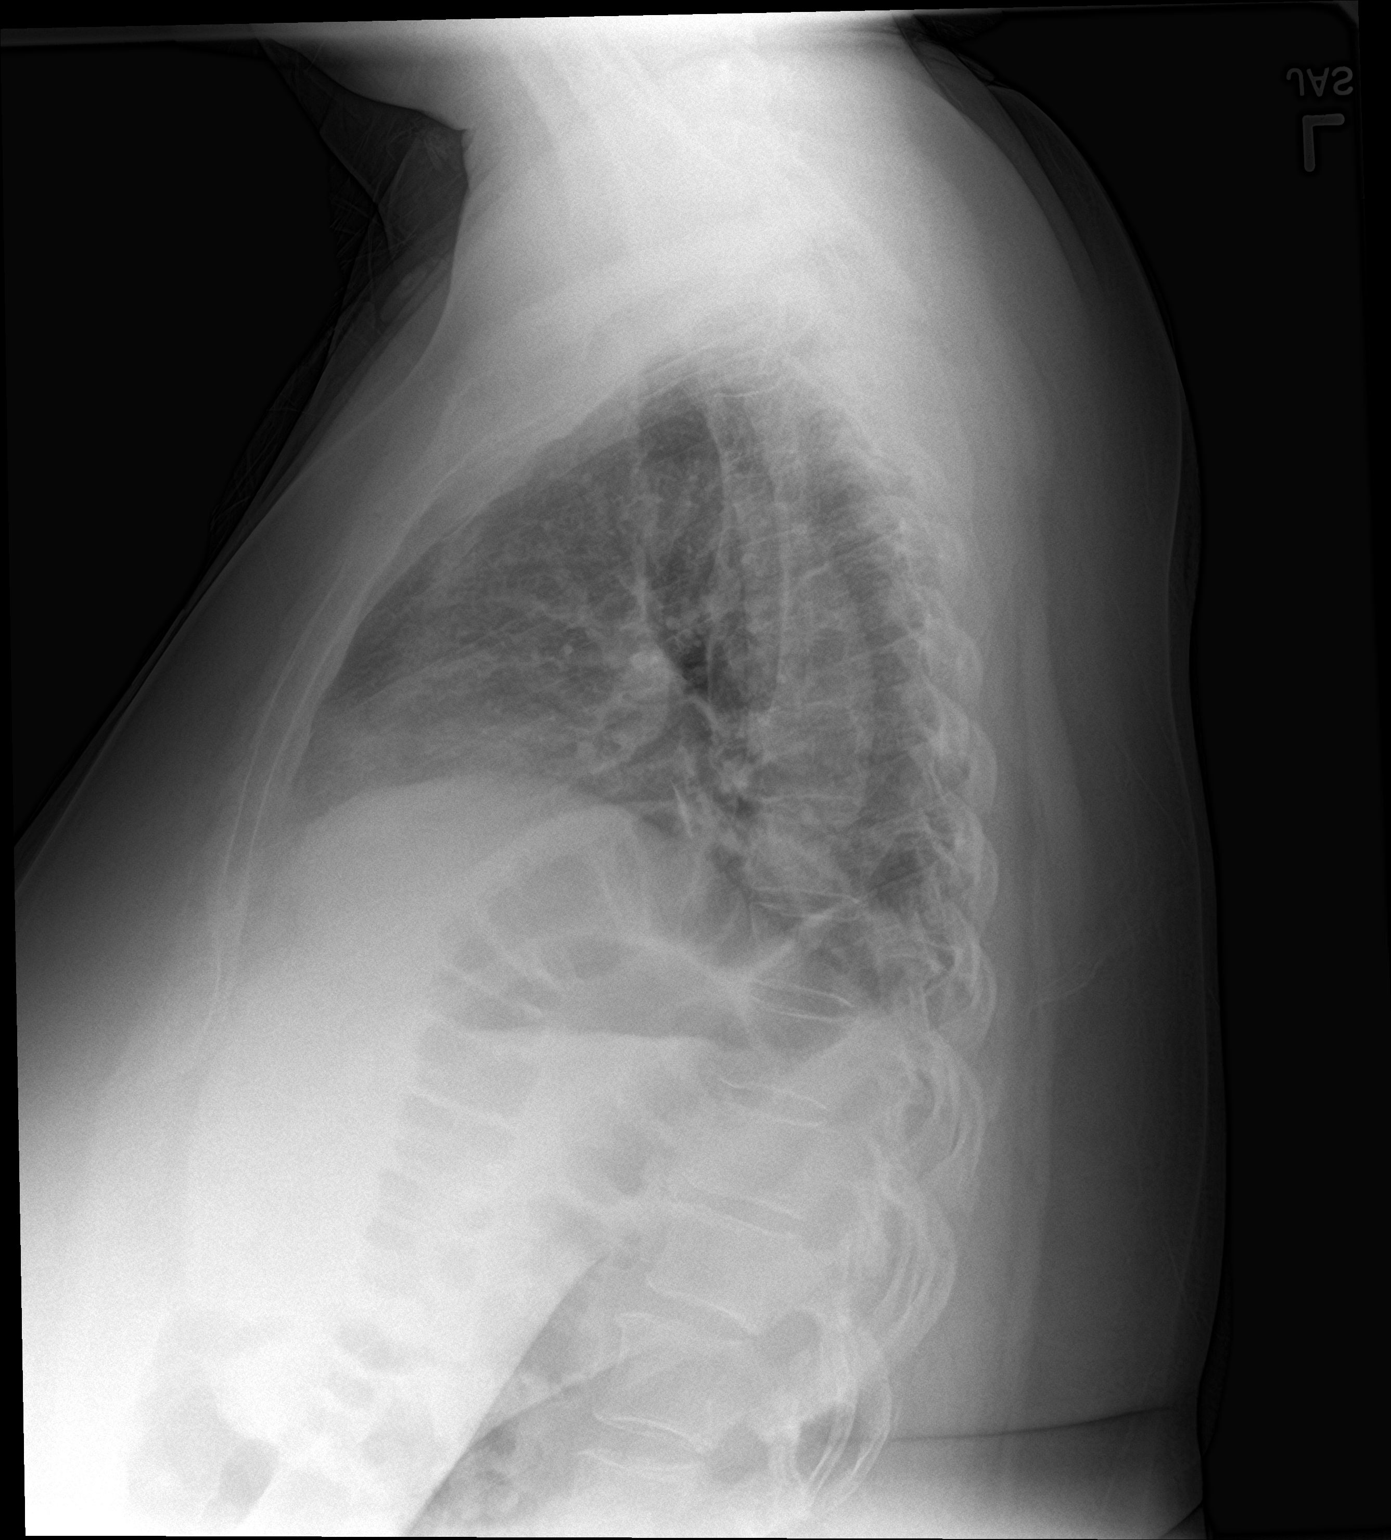

[2 of 2 positions shown; findings below may reference images not displayed]

FINDINGS: Improved lung volumes from prior exam. Heart is normal in size.
Normal mediastinal contours. No pulmonary edema, focal airspace
disease, pleural effusion or pneumothorax. No acute osseous
abnormalities are seen.
IMPRESSION: No acute chest findings.

## 2019-11-17 IMAGING — CT CT ANGIO NECK
1 of 11 series · 5 of 33 positions shown · IV contrast (omnipaque)
Comparison: Report of brain MRI [DATE] (no images available).

CLINICAL DATA: 53-year-old female woke at [7X] hours with right
side numbness and weakness. Nausea, chest tightness.

EXAM:
CT ANGIOGRAPHY HEAD AND NECK
TECHNIQUE: Multidetector CT imaging of the head and neck was performed using
the standard protocol during bolus administration of intravenous
contrast. Multiplanar CT image reconstructions and MIPs were
obtained to evaluate the vascular anatomy. Carotid stenosis
measurements (when applicable) are obtained utilizing NASCET
criteria, using the distal internal carotid diameter as the
denominator.
CONTRAST:  100mL OMNIPAQUE IOHEXOL 350 MG/ML SOLN

[Series 12: axial thin · axial · 0.39mm/px · z∈[-344,-114]mm · 5 of 346 slices shown]
[im 58/346  soft-tissue]
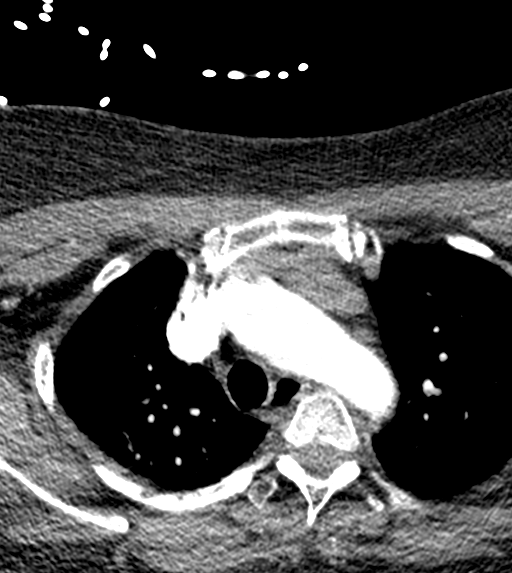
[im 116/346  bone]
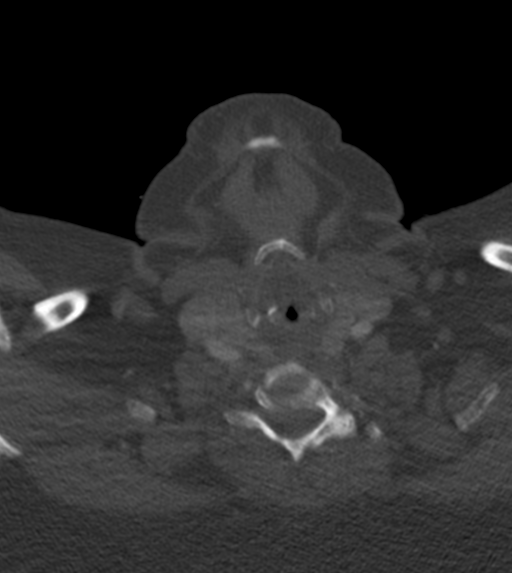
[im 173/346  soft-tissue]
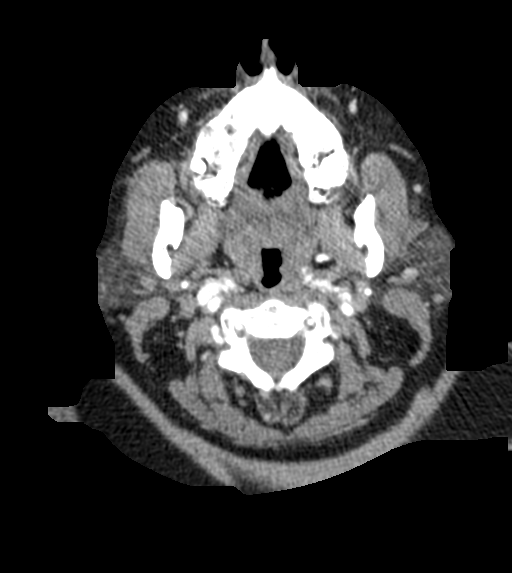
[im 231/346  bone]
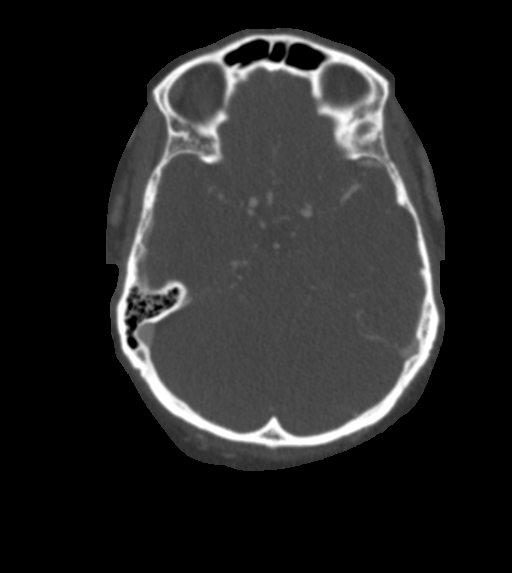
[im 288/346  soft-tissue]
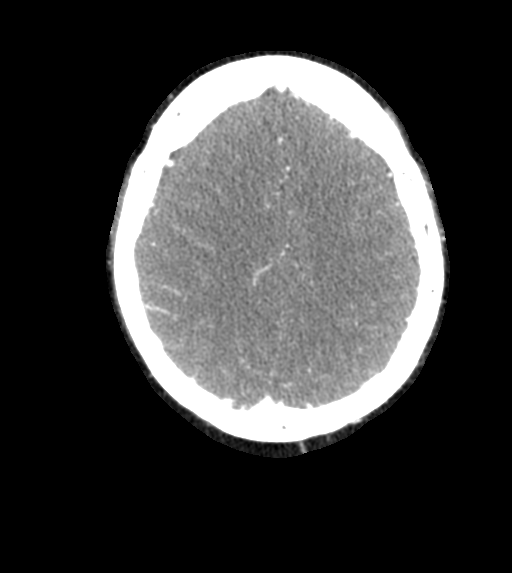

[5 of 33 positions shown; findings below may reference images not displayed]

FINDINGS: CT HEAD

Brain: Normal cerebral volume. No midline shift, ventriculomegaly,
mass effect, evidence of mass lesion, intracranial hemorrhage or
evidence of cortically based acute infarction. Gray-white matter
differentiation is within normal limits throughout the brain.

Calvarium and skull base: Negative.

Paranasal sinuses: Visualized paranasal sinuses and mastoids are
clear.

Orbits: Visualized orbits and scalp soft tissues are within normal
limits.

CTA NECK

Skeleton: Carious dentition. Partially visible thoracic scoliosis.
No acute osseous abnormality identified.

Upper chest: Negative.

Other neck: Generalized thyroid enlargement, with a 12 mm right lobe
nodule which does not meet criteria for follow-up. No follow-up
indicated. (Ref: [HOSPITAL]. [DATE]): 143-50).

Partially retropharyngeal course of both carotids. Otherwise
negative neck soft tissues.

Aortic arch: 3 vessel arch configuration.  No arch atherosclerosis.

Right carotid system: Tortuous brachiocephalic artery and proximal
right CCA without plaque or stenosis. Retropharyngeal carotid
bifurcation without plaque or stenosis.

Left carotid system: Tortuous proximal left CCA and retropharyngeal
left carotid bifurcation. No plaque or stenosis.

Vertebral arteries:
Tortuous but otherwise normal proximal right subclavian artery with
normal right vertebral artery origin. However, the right vertebral
is diminutive throughout the neck. It remains patent to the skull
base with no plaque or stenosis identified.

Minimal plaque in the proximal left subclavian artery without
stenosis. Diminutive left vertebral artery, also patent to the skull
base with no plaque or stenosis identified.

CTA HEAD

Posterior circulation: Patent but diminutive distal vertebral
arteries, vertebrobasilar junction and basilar artery. Patent PICA
origins, AICA and SCA origins. Fetal type bilateral PCA origins. A
small left P1 segment is present. Bilateral PCA branches are within
normal limits.

Anterior circulation: Both ICA siphons are patent. There is mild
calcified siphon plaque on the left but no associated stenosis.
Bilateral ophthalmic and posterior communicating artery origins are
normal. Patent carotid termini. Normal MCA and ACA origins. Mildly
tortuous A1 segments. Diminutive or absent anterior communicating
artery. Bilateral ACA branches are within normal limits.

Left MCA M1 segment and left MCA trifurcation is patent without
stenosis. Left MCA branches are within normal limits. Right MCA M1
segment and right MCA bifurcation are patent without stenosis. Right
MCA branches are within normal limits.

Venous sinuses: Patent.

Anatomic variants: Fetal type bilateral PCA origins with diminutive
vertebrobasilar system. Retropharyngeal carotids.

Review of the MIP images confirms the above findings
IMPRESSION: 1. Negative for large vessel occlusion.
2. Normal CT appearance of the brain.
3. Minimal atherosclerosis, involving the Left ICA siphon. No
arterial stenosis identified in the head or neck.
4. Carious dentition.  Thyroid goiter.

## 2019-11-17 MED ORDER — IOHEXOL 350 MG/ML SOLN
100.0000 mL | Freq: Once | INTRAVENOUS | Status: AC | PRN
  Administered 2019-11-17: 100 mL via INTRAVENOUS

## 2019-11-17 MED ORDER — ASPIRIN 81 MG PO CHEW
81.0000 mg | CHEWABLE_TABLET | Freq: Every day | ORAL | Status: DC
  Administered 2019-11-17 – 2019-11-18 (×2): 81 mg via ORAL
  Filled 2019-11-17 (×2): qty 1

## 2019-11-17 MED ORDER — SODIUM CHLORIDE 0.9 % IV SOLN: INTRAVENOUS | Status: DC

## 2019-11-17 MED ORDER — ACETAMINOPHEN 160 MG/5ML PO SOLN: 650.0000 mg | ORAL | Status: DC | PRN

## 2019-11-17 MED ORDER — HEPARIN SODIUM (PORCINE) 5000 UNIT/ML IJ SOLN
5000.0000 [IU] | Freq: Three times a day (TID) | INTRAMUSCULAR | Status: DC
  Administered 2019-11-17 – 2019-11-18 (×4): 5000 [IU] via SUBCUTANEOUS
  Filled 2019-11-17 (×4): qty 1

## 2019-11-17 MED ORDER — STROKE: EARLY STAGES OF RECOVERY BOOK
Freq: Once | Status: AC
  Filled 2019-11-17: qty 1

## 2019-11-17 MED ORDER — ACETAMINOPHEN 325 MG PO TABS
650.0000 mg | ORAL_TABLET | ORAL | Status: DC | PRN
  Administered 2019-11-18: 650 mg via ORAL
  Filled 2019-11-17: qty 2

## 2019-11-17 MED ORDER — ATORVASTATIN CALCIUM 80 MG PO TABS
80.0000 mg | ORAL_TABLET | Freq: Every day | ORAL | Status: DC
  Administered 2019-11-17 – 2019-11-18 (×2): 80 mg via ORAL
  Filled 2019-11-17 (×2): qty 1

## 2019-11-17 MED ORDER — PANTOPRAZOLE SODIUM 40 MG IV SOLR
40.0000 mg | INTRAVENOUS | Status: DC
  Administered 2019-11-17: 40 mg via INTRAVENOUS
  Filled 2019-11-17: qty 40

## 2019-11-17 MED ORDER — ACETAMINOPHEN 650 MG RE SUPP: 650.0000 mg | RECTAL | Status: DC | PRN

## 2019-11-17 NOTE — ED Provider Notes (Signed)
[facility] EMERGENCY DEPARTMENT Provider Note   CSN: [id number] Arrival date & time: 11/17/19  0865     History Chief Complaint  Patient presents with  . Numbness    Michelle Yang is a 54 y.o. female.  HPI     54yo female with no significant medical history presents with concern for right sided numbness and weakness.    Last known well was 2200 Woke at 1AM with right sided numbness and weakness Developed chest tightness on the right side Brief right sided visual changes  No headache, nausea, vomiting, dyspnea No abdominal pain  No hx of smoking, etoh drugs No fam hx of early CVA  Past Medical History:  Diagnosis Date  . Medical history non-contributory     Patient Active Problem List   Diagnosis Date Noted  . TIA (transient ischemic attack) 11/17/2019  . Weakness of right side of body 11/17/2019  . Elevated blood pressure reading without diagnosis of hypertension 11/17/2019  . Abnormal serum creatinine level 11/17/2019  . Elevated glucose 11/17/2019  . 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 ANKLE FRACTURE SURGERY Left      OB History    Gravida  3   Para  3   Term  3   Preterm      AB      Living  3     SAB      TAB      Ectopic      Multiple      Live Births  3           Family History  Problem Relation Age of Onset  . Diabetes Father     Social History   Tobacco Use  . Smoking status: Never Smoker  . Smokeless tobacco: Never Used  Vaping Use  . Vaping Use: Never assessed  Substance Use Topics  . Alcohol use: No  . Drug use: No    Home Medications Prior to Admission medications   Medication Sig Start Date End Date Taking? Authorizing Provider  Aspirin-Acetaminophen-Caffeine (GOODYS EXTRA STRENGTH) [id number] MG PACK Take 1 packet by mouth 2 (two) times daily as needed (headache/migraine).   Yes [provider]  Multiple Vitamin (MULTIVITAMIN WITH MINERALS) TABS tablet Take 1 tablet by mouth at bedtime.   Yes [provider]    Allergies    Patient has no known allergies.  Review of Systems   Review of Systems  Constitutional: Negative for fever.  HENT: Negative for sore throat.   Eyes: Negative for visual disturbance.  Respiratory: Negative for cough and shortness of breath.   Cardiovascular: Positive for chest pain.  Gastrointestinal: Negative for abdominal pain, diarrhea, nausea and vomiting.  Genitourinary: Negative for difficulty urinating.  Musculoskeletal: Negative for back pain and neck pain.  Skin: Negative for rash.  Neurological: Positive for weakness and numbness. Negative for dizziness, syncope, facial asymmetry, speech difficulty and headaches.    Physical Exam Updated Vital Signs BP (!) 159/110 (BP Location: Left Arm)   Pulse 78   Temp 97.7 F (36.5 C) (Oral)   Resp 16   Ht  (1.626 m)   Wt 95.7 kg   SpO2 95%   BMI 36.22 kg/m   Physical Exam Vitals and nursing note reviewed.  Constitutional:      General: She is not in acute distress.    Appearance: She is well-developed. She  is not diaphoretic.  HENT:     Head: Normocephalic and atraumatic.  Eyes:     General: No visual field deficit.    Conjunctiva/sclera: Conjunctivae normal.  Cardiovascular:     Rate and Rhythm: Normal rate and regular rhythm.     Heart sounds: Normal heart sounds. No murmur heard.  No friction rub. No gallop.   Pulmonary:     Effort: Pulmonary effort is normal. No respiratory distress.     Breath sounds: Normal breath sounds. No wheezing or rales.  Abdominal:     General: There is no distension.     Palpations: Abdomen is soft.     Tenderness: There is no abdominal tenderness. There is no guarding.  Musculoskeletal:        General: No tenderness.     Cervical back: Normal range of motion.  Skin:    General: Skin is warm and dry.     Findings: No  erythema or rash.  Neurological:     Mental Status: She is alert and oriented to person, place, and time.     GCS: GCS eye subscore is 4. GCS verbal subscore is 5. GCS motor subscore is 6.     Cranial Nerves: Cranial nerves are intact. No cranial nerve deficit, dysarthria or facial asymmetry.     Sensory: Sensory deficit (right sided) present.     Motor: Weakness (right UE and LE) and pronator drift present.     Coordination: Coordination is intact.     ED Results / Procedures / Treatments   Labs (all labs ordered are listed, but only abnormal results are displayed) Labs Reviewed  CBC - Abnormal; Notable for the following components:      Result Value   RBC 5.27 (*)    All other components within normal limits  COMPREHENSIVE METABOLIC PANEL - Abnormal; Notable for the following components:   Glucose, Bld 158 (*)    Creatinine, Ser 1.11 (*)    Total Bilirubin 0.2 (*)    GFR calc non Af Amer 57 (*)    All other components within normal limits  LIPID PANEL - Abnormal; Notable for the following components:   Cholesterol 222 (*)    LDL Cholesterol 153 (*)    All other components within normal limits  RETICULOCYTES - Abnormal; Notable for the following components:   RBC. 5.25 (*)    All other components within normal limits  HEMOGLOBIN A1C - Abnormal; Notable for the following components:   Hgb A1c MFr Bld 6.3 (*)    All other components within normal limits  CBG MONITORING, ED - Abnormal; Notable for the following components:   Glucose-Capillary 151 (*)    All other components within normal limits  RESPIRATORY PANEL BY RT PCR (FLU A&B, COVID)  ETHANOL  PROTIME-INR  APTT  DIFFERENTIAL  RAPID URINE DRUG SCREEN, HOSP PERFORMED  URINALYSIS, ROUTINE W REFLEX MICROSCOPIC  PREGNANCY, URINE  HIV ANTIBODY (ROUTINE TESTING W REFLEX)  TSH  T4, FREE  VITAMIN B12  FOLATE  IRON AND TIBC  FERRITIN  CBC WITH DIFFERENTIAL/PLATELET  COMPREHENSIVE METABOLIC PANEL  TYPE AND SCREEN    ABO/RH  TROPONIN I (HIGH SENSITIVITY)  TROPONIN I (HIGH SENSITIVITY)    EKG EKG Interpretation  Date/Time:  Monday November 17 2019 08:44:40 EDT Ventricular Rate:  100 PR Interval:    QRS Duration: 79 QT Interval:  356 QTC Calculation: 460 R Axis:   30 Text Interpretation: Sinus tachycardia Low voltage, precordial leads Abnormal R-wave progression, early transition  Borderline T wave abnormalities Baseline wander in lead(s) III No significant change since last tracing Confirmed by Alvira Monday ([id number]) on 11/17/2019 9:23:21 AM   Radiology CT Angio Head W or Wo Contrast  Result Date: 11/17/2019 CLINICAL DATA:  54 year old female woke at 0100 hours with right side numbness and weakness. Nausea, chest tightness. EXAM: CT ANGIOGRAPHY HEAD AND NECK TECHNIQUE: Multidetector CT imaging of the head and neck was performed using the standard protocol during bolus administration of intravenous contrast. Multiplanar CT image reconstructions and MIPs were obtained to evaluate the vascular anatomy. Carotid stenosis measurements (when applicable) are obtained utilizing NASCET criteria, using the distal internal carotid diameter as the denominator. CONTRAST:  OMNIPAQUE IOHEXOL 350 MG/ML SOLN COMPARISON:  Report of brain MRI 10/20/1999 (no images available). FINDINGS: CT HEAD Brain: Normal cerebral volume. No midline shift, ventriculomegaly, mass effect, evidence of mass lesion, intracranial hemorrhage or evidence of cortically based acute infarction. Gray-white matter differentiation is within normal limits throughout the brain. Calvarium and skull base: Negative. Paranasal sinuses: Visualized paranasal sinuses and mastoids are clear. Orbits: Visualized orbits and scalp soft tissues are within normal limits. CTA NECK Skeleton: Carious dentition. Partially visible thoracic scoliosis. No acute osseous abnormality identified. Upper chest: Negative. Other neck: Generalized thyroid enlargement, with a 12 mm  right lobe nodule which does not meet criteria for follow-up. No follow-up indicated. (Ref: J Am Coll Radiol. 2015 Feb;12(2): 143-50). Partially retropharyngeal course of both carotids. Otherwise negative neck soft tissues. Aortic arch: 3 vessel arch configuration.  No arch atherosclerosis. Right carotid system: Tortuous brachiocephalic artery and proximal right CCA without plaque or stenosis. Retropharyngeal carotid bifurcation without plaque or stenosis. Left carotid system: Tortuous proximal left CCA and retropharyngeal left carotid bifurcation. No plaque or stenosis. Vertebral arteries: Tortuous but otherwise normal proximal right subclavian artery with normal right vertebral artery origin. However, the right vertebral is diminutive throughout the neck. It remains patent to the skull base with no plaque or stenosis identified. Minimal plaque in the proximal left subclavian artery without stenosis. Diminutive left vertebral artery, also patent to the skull base with no plaque or stenosis identified. CTA HEAD Posterior circulation: Patent but diminutive distal vertebral arteries, vertebrobasilar junction and basilar artery. Patent PICA origins, AICA and SCA origins. Fetal type bilateral PCA origins. A small left P1 segment is present. Bilateral PCA branches are within normal limits. Anterior circulation: Both ICA siphons are patent. There is mild calcified siphon plaque on the left but no associated stenosis. Bilateral ophthalmic and posterior communicating artery origins are normal. Patent carotid termini. Normal MCA and ACA origins. Mildly tortuous A1 segments. Diminutive or absent anterior communicating artery. Bilateral ACA branches are within normal limits. Left MCA M1 segment and left MCA trifurcation is patent without stenosis. Left MCA branches are within normal limits. Right MCA M1 segment and right MCA bifurcation are patent without stenosis. Right MCA branches are within normal limits. Venous sinuses:  Patent. Anatomic variants: Fetal type bilateral PCA origins with diminutive vertebrobasilar system. Retropharyngeal carotids. Review of the MIP images confirms the above findings IMPRESSION: 1. Negative for large vessel occlusion. 2. Normal CT appearance of the brain. 3. Minimal atherosclerosis, involving the Left ICA siphon. No arterial stenosis identified in the head or neck. 4. Carious dentition.  Thyroid goiter. Electronically Signed   By: Odessa Fleming M.D.   On: 11/17/2019 09:40   DG Chest 2 View  Result Date: 11/17/2019 CLINICAL DATA:  Hypertension.  Right-sided numbness, now resolved. EXAM: CHEST - 2 VIEW COMPARISON:  Chest radiograph 01/17/2017 FINDINGS: Improved lung volumes from prior exam. Heart is normal in size. Normal mediastinal contours. No pulmonary edema, focal airspace disease, pleural effusion or pneumothorax. No acute osseous abnormalities are seen. IMPRESSION: No acute chest findings. Electronically Signed   By: Narda Rutherford M.D.   On: 11/17/2019 19:43   CT Angio Neck W and/or Wo Contrast  Result Date: 11/17/2019 CLINICAL DATA:  54 year old female woke at 0100 hours with right side numbness and weakness. Nausea, chest tightness. EXAM: CT ANGIOGRAPHY HEAD AND NECK TECHNIQUE: Multidetector CT imaging of the head and neck was performed using the standard protocol during bolus administration of intravenous contrast. Multiplanar CT image reconstructions and MIPs were obtained to evaluate the vascular anatomy. Carotid stenosis measurements (when applicable) are obtained utilizing NASCET criteria, using the distal internal carotid diameter as the denominator. CONTRAST:  OMNIPAQUE IOHEXOL 350 MG/ML SOLN COMPARISON:  Report of brain MRI 10/20/1999 (no images available). FINDINGS: CT HEAD Brain: Normal cerebral volume. No midline shift, ventriculomegaly, mass effect, evidence of mass lesion, intracranial hemorrhage or evidence of cortically based acute infarction. Gray-white matter  differentiation is within normal limits throughout the brain. Calvarium and skull base: Negative. Paranasal sinuses: Visualized paranasal sinuses and mastoids are clear. Orbits: Visualized orbits and scalp soft tissues are within normal limits. CTA NECK Skeleton: Carious dentition. Partially visible thoracic scoliosis. No acute osseous abnormality identified. Upper chest: Negative. Other neck: Generalized thyroid enlargement, with a 12 mm right lobe nodule which does not meet criteria for follow-up. No follow-up indicated. (Ref: J Am Coll Radiol. 2015 Feb;12(2): 143-50). Partially retropharyngeal course of both carotids. Otherwise negative neck soft tissues. Aortic arch: 3 vessel arch configuration.  No arch atherosclerosis. Right carotid system: Tortuous brachiocephalic artery and proximal right CCA without plaque or stenosis. Retropharyngeal carotid bifurcation without plaque or stenosis. Left carotid system: Tortuous proximal left CCA and retropharyngeal left carotid bifurcation. No plaque or stenosis. Vertebral arteries: Tortuous but otherwise normal proximal right subclavian artery with normal right vertebral artery origin. However, the right vertebral is diminutive throughout the neck. It remains patent to the skull base with no plaque or stenosis identified. Minimal plaque in the proximal left subclavian artery without stenosis. Diminutive left vertebral artery, also patent to the skull base with no plaque or stenosis identified. CTA HEAD Posterior circulation: Patent but diminutive distal vertebral arteries, vertebrobasilar junction and basilar artery. Patent PICA origins, AICA and SCA origins. Fetal type bilateral PCA origins. A small left P1 segment is present. Bilateral PCA branches are within normal limits. Anterior circulation: Both ICA siphons are patent. There is mild calcified siphon plaque on the left but no associated stenosis. Bilateral ophthalmic and posterior communicating artery origins are  normal. Patent carotid termini. Normal MCA and ACA origins. Mildly tortuous A1 segments. Diminutive or absent anterior communicating artery. Bilateral ACA branches are within normal limits. Left MCA M1 segment and left MCA trifurcation is patent without stenosis. Left MCA branches are within normal limits. Right MCA M1 segment and right MCA bifurcation are patent without stenosis. Right MCA branches are within normal limits. Venous sinuses: Patent. Anatomic variants: Fetal type bilateral PCA origins with diminutive vertebrobasilar system. Retropharyngeal carotids. Review of the MIP images confirms the above findings IMPRESSION: 1. Negative for large vessel occlusion. 2. Normal CT appearance of the brain. 3. Minimal atherosclerosis, involving the Left ICA siphon. No arterial stenosis identified in the head or neck. 4. Carious dentition.  Thyroid goiter. Electronically Signed   By: Odessa Fleming M.D.   On:  11/17/2019 09:40   MR BRAIN WO CONTRAST  Result Date: 11/17/2019 CLINICAL DATA:  Acute presentation with right-sided weakness. Negative acute CT evaluation. EXAM: MRI HEAD WITHOUT CONTRAST TECHNIQUE: Multiplanar, multiecho pulse sequences of the brain and surrounding structures were obtained without intravenous contrast. COMPARISON:  CT studies same day. FINDINGS: Brain: The brain has a normal appearance without evidence of malformation, atrophy, old or acute small or large vessel infarction, mass lesion, hemorrhage, hydrocephalus or extra-axial collection. Vascular: Major vessels at the base of the brain show flow. Venous sinuses appear patent. Skull and upper cervical spine: Normal. Sinuses/Orbits: Clear/normal. Other: None significant. IMPRESSION: Normal examination.  No cause of right-sided weakness identified. Electronically Signed   By: Paulina FusiMark  Shogry M.D.   On: 11/17/2019 19:24    Procedures Procedures (including critical care time)  Medications Ordered in ED Medications  0.9 %  sodium chloride infusion (  Intravenous Not Given 11/17/19 1708)  acetaminophen (TYLENOL) tablet 650 mg (has no administration in time range)    Or  acetaminophen (TYLENOL) 160 MG/5ML solution 650 mg (has no administration in time range)    Or  acetaminophen (TYLENOL) suppository 650 mg (has no administration in time range)  heparin injection 5,000 Units (5,000 Units Subcutaneous Given 11/17/19 2055)  aspirin chewable tablet 81 mg (81 mg Oral Given 11/17/19 1617)  atorvastatin (LIPITOR) tablet 80 mg (80 mg Oral Given 11/17/19 1617)  pantoprazole (PROTONIX) injection 40 mg (40 mg Intravenous Given 11/17/19 1618)  iohexol (OMNIPAQUE) 350 MG/ML injection 100 mL (100 mLs Intravenous Contrast Given 11/17/19 0917)   stroke: mapping our early stages of recovery book ( Does not apply Given 11/17/19 1708)    ED Course  I have reviewed the triage vital signs and the nursing notes.  Pertinent labs & imaging results that were available during my care of the patient were reviewed by me and considered in my medical decision making (see chart for details).    MDM Rules/Calculators/A&P                          53yo female with no significant medical history presents with concern for right sided weakness.  Presents out of tPA window and VAN negative so Code Stroke not called however CTA done to evaluate for large vessel occlusion completed without acute abnormalities.  No significant electrolyte abnormalities. Reports right sided chest tightness in addition to neurologic symptoms--hx and exam is not consistent with aortic dissection. Troponin negative. Hx not consistent with PE.    CTA without acute abnormalities, shows minimal left ICA siphon atherosclerosis.  Repeat exam with significant improvement in symptoms.  Concern for CVA vs TIA by exam.  Discussed with Neurology. Will admit for further evaluation.    Final Clinical Impression(s) / ED Diagnoses Final diagnoses:  TIA (transient ischemic attack)  Right sided weakness  Right  sided numbness    Rx / DC Orders ED Discharge Orders    None       Alvira MondaySchlossman, Celestine Bougie, MD 11/17/19 2113

## 2019-11-17 NOTE — ED Notes (Signed)
Pt ambulatory to restroom without difficulty.

## 2019-11-17 NOTE — Progress Notes (Signed)
Pt back from MRI, no complaints voiced, will cont to monitor.

## 2019-11-17 NOTE — Progress Notes (Signed)
  Echocardiogram 2D Echocardiogram has been attempted. Patient is getting labs and leaving for MRI. Will reattempt at later date.  Michelle Yang [name] 11/17/2019, 4:20 PM

## 2019-11-17 NOTE — ED Notes (Signed)
Report provided for [facility] for transport to [facility]

## 2019-11-17 NOTE — ED Notes (Signed)
Pt states her symptoms have improved, no right sided arm or leg drift noted at this time.

## 2019-11-17 NOTE — ED Notes (Signed)
Patient transported to CT 

## 2019-11-17 NOTE — ED Notes (Signed)
Attempted to call report to nurse at [facility], was in a patients room, will call back to receive report.

## 2019-11-17 NOTE — ED Triage Notes (Signed)
Pt states she woke at 0100 with right sided numbness and weakness. LKW 2200 yesterday. Pt complaining of nausea, chest tightness and right sided numbness to arm and face. Intermittent right arm weakness per pt.

## 2019-11-17 NOTE — H&P (Signed)
History and Physical    Michelle Yang ZOX:[id number] DOB: [birth year] DOA: 11/17/2019  PCP: Patient, No Pcp Per    Patient coming from: [facility]    Chief Complaint:  Right sided weakness.  HPI: Michelle Yang is a 54 y.o. female with no diagnosed past medical history seen as direct admit at [facility] for rt sided weakness. Pt has had CTA of head and neck with no LVO. Pt does not drink or smoke.  Per patient report today at 1 AM she felt numbness affecting the right side of her face or upper extremities or right lower extremity everything on her right side it lasted a few minutes she tried to get out of bed and use the restroom.  She then returned to her bed and fell asleep at about 6 AM when she got up she still had the sensation affecting the right side she called her daughter and went to the urgent care.  From the urgent care patient had CT imaging and her symptoms resolved.  She reports that this is never happened to her again she denies any chest pain palpitations shortness of breath stomach symptoms any recent travels any rashes patient reports that she has a weak right eye and has lost her glasses and since losing her glasses her vision in her right eye has been poor.  Patient also denies any smoking history or secondhand exposure to smoking and also denies any alcohol or drugs.  She denies any allergies and has not had preventive health maintenance as far as colonoscopy.  She does have a primary care physician.  She states that she takes [place] powders routinely for her migraines.  ED Course:  N/A.   Review of Systems: As per HPI otherwise all systems reviewed and negative.  Past Medical History:  Diagnosis Date  . Medical history non-contributory     Past Surgical History:  Procedure Laterality Date  . ANKLE FRACTURE SURGERY Left      reports that she has never smoked. She has never used smokeless tobacco. She reports that she does not drink alcohol and does  not use drugs.  No Known Allergies  Family History  Problem Relation Age of Onset  . Diabetes Father     Prior to Admission medications   Medication Sig Start Date End Date Taking? Authorizing Provider  Aspirin-Acetaminophen-Caffeine (GOODYS EXTRA STRENGTH) [phone] MG PACK Take 1 packet by mouth 2 (two) times daily as needed (headache/migraine).   Yes [provider]  Multiple Vitamin (MULTIVITAMIN WITH MINERALS) TABS tablet Take 1 tablet by mouth at bedtime.   Yes [provider]    Physical Exam: Vitals:   11/17/19 1320 11/17/19 1330 11/17/19 1443 11/17/19 1529  BP: 107/88 (!) 150/106 (!) 148/96 (!) 148/108  Pulse: 72 81 80 86  Resp: 18  18 16   Temp:    97.9 F (36.6 C)  TempSrc:    Oral  SpO2: 100% 97% 99% 99%  Weight:      Height:        Constitutional: NAD, calm, comfortable Vitals:   11/17/19 1320 11/17/19 1330 11/17/19 1443 11/17/19 1529  BP: 107/88 (!) 150/106 (!) 148/96 (!) 148/108  Pulse: 72 81 80 86  Resp: 18  18 16   Temp:    97.9 F (36.6 C)  TempSrc:    Oral  SpO2: 100% 97% 99% 99%  Weight:      Height:       Eyes: PERRL, EOMI lids and conjunctivae  normal ENMT: Mucous membranes are moist. Posterior pharynx clear of any exudate or lesions.Normal dentition.  Neck: normal, supple, no masses, no thyromegaly, no carotid bruit  Respiratory: clear to auscultation bilaterally, no wheezing, no crackles. Normal respiratory effort. No accessory muscle use.  Cardiovascular: Regular rate and rhythm, no murmurs / rubs / gallops. No extremity edema. 2+ pedal pulses.  Abdomen: no tenderness, no masses palpated. No hepatosplenomegaly. Bowel sounds positive.  Musculoskeletal: no clubbing / cyanosis. No joint deformity upper and lower extremities. Pt moving all four ext , no contractures. Normal muscle tone.  Skin: no rashes, lesions, ulcers. No induration Neurologic: CN 2-12 grossly intact. Sensation intact, DTR normal. Strength 5/5 in all 4.    Psychiatric: Normal judgment and insight. Alert and oriented x 3. Normal mood.   Labs on Admission: I have personally reviewed following labs and imaging studies  CBC: Recent Labs  Lab 11/17/19 0900  WBC 5.3  NEUTROABS 3.2  HGB 14.0  HCT 43.9  MCV 83.3  PLT 314   Basic Metabolic Panel: Recent Labs  Lab 11/17/19 0900  NA 141  K 4.0  CL 102  CO2 27  GLUCOSE 158*  BUN 9  CREATININE 1.11*  CALCIUM 9.5   GFR: Estimated Creatinine Clearance: 65.8 mL/min (A) (by C-G formula based on SCr of 1.11 mg/dL (H)). Liver Function Tests: Recent Labs  Lab 11/17/19 0900  AST 20  ALT 20  ALKPHOS 71  BILITOT 0.2*  PROT 7.2  ALBUMIN 4.0   No results for input(s): LIPASE, AMYLASE in the last 168 hours. No results for input(s): AMMONIA in the last 168 hours. Coagulation Profile: Recent Labs  Lab 11/17/19 0900  INR 1.0   Cardiac Enzymes: No results for input(s): CKTOTAL, CKMB, CKMBINDEX, TROPONINI in the last 168 hours. BNP (last 3 results) No results for input(s): PROBNP in the last 8760 hours. HbA1C: No results for input(s): HGBA1C in the last 72 hours. CBG: Recent Labs  Lab 11/17/19 0857  GLUCAP 151*   Lipid Profile: No results for input(s): CHOL, HDL, LDLCALC, TRIG, CHOLHDL, LDLDIRECT in the last 72 hours. Thyroid Function Tests: No results for input(s): TSH, T4TOTAL, FREET4, T3FREE, THYROIDAB in the last 72 hours. Anemia Panel: Recent Labs    11/17/19 1615  RETICCTPCT 1.3   Urine analysis:    Component Value Date/Time   COLORURINE YELLOW 11/17/2019 0946   APPEARANCEUR CLEAR 11/17/2019 0946   LABSPEC 1.010 11/17/2019 0946   PHURINE 7.0 11/17/2019 0946   GLUCOSEU NEGATIVE 11/17/2019 0946   HGBUR NEGATIVE 11/17/2019 0946   BILIRUBINUR NEGATIVE 11/17/2019 0946   KETONESUR NEGATIVE 11/17/2019 0946   PROTEINUR NEGATIVE 11/17/2019 0946   UROBILINOGEN 0.2 02/24/2008 1206   NITRITE NEGATIVE 11/17/2019 0946   LEUKOCYTESUR NEGATIVE 11/17/2019 0946   No intake  or output data in the 24 hours ending 11/17/19 1719 Lab Results  Component Value Date   CREATININE 1.11 (H) 11/17/2019   CREATININE 0.72 01/17/2017    COVID-19 Labs  No results for input(s): DDIMER, FERRITIN, LDH, CRP in the last 72 hours.  Lab Results  Component Value Date   SARSCOV2NAA NEGATIVE 11/17/2019    Radiological Exams on Admission: CT Angio Head W or Wo Contrast  Result Date: 11/17/2019 CLINICAL DATA:  54 year old female woke at 0100 hours with right side numbness and weakness. Nausea, chest tightness. EXAM: CT ANGIOGRAPHY HEAD AND NECK TECHNIQUE: Multidetector CT imaging of the head and neck was performed using the standard protocol during bolus administration of intravenous contrast. Multiplanar CT image  reconstructions and MIPs were obtained to evaluate the vascular anatomy. Carotid stenosis measurements (when applicable) are obtained utilizing NASCET criteria, using the distal internal carotid diameter as the denominator. CONTRAST:  OMNIPAQUE IOHEXOL 350 MG/ML SOLN COMPARISON:  Report of brain MRI 10/20/1999 (no images available). FINDINGS: CT HEAD Brain: Normal cerebral volume. No midline shift, ventriculomegaly, mass effect, evidence of mass lesion, intracranial hemorrhage or evidence of cortically based acute infarction. Gray-white matter differentiation is within normal limits throughout the brain. Calvarium and skull base: Negative. Paranasal sinuses: Visualized paranasal sinuses and mastoids are clear. Orbits: Visualized orbits and scalp soft tissues are within normal limits. CTA NECK Skeleton: Carious dentition. Partially visible thoracic scoliosis. No acute osseous abnormality identified. Upper chest: Negative. Other neck: Generalized thyroid enlargement, with a 12 mm right lobe nodule which does not meet criteria for follow-up. No follow-up indicated. (Ref: J Am Coll Radiol. 2015 Feb;12(2): 143-50). Partially retropharyngeal course of both carotids. Otherwise negative  neck soft tissues. Aortic arch: 3 vessel arch configuration.  No arch atherosclerosis. Right carotid system: Tortuous brachiocephalic artery and proximal right CCA without plaque or stenosis. Retropharyngeal carotid bifurcation without plaque or stenosis. Left carotid system: Tortuous proximal left CCA and retropharyngeal left carotid bifurcation. No plaque or stenosis. Vertebral arteries: Tortuous but otherwise normal proximal right subclavian artery with normal right vertebral artery origin. However, the right vertebral is diminutive throughout the neck. It remains patent to the skull base with no plaque or stenosis identified. Minimal plaque in the proximal left subclavian artery without stenosis. Diminutive left vertebral artery, also patent to the skull base with no plaque or stenosis identified. CTA HEAD Posterior circulation: Patent but diminutive distal vertebral arteries, vertebrobasilar junction and basilar artery. Patent PICA origins, AICA and SCA origins. Fetal type bilateral PCA origins. A small left P1 segment is present. Bilateral PCA branches are within normal limits. Anterior circulation: Both ICA siphons are patent. There is mild calcified siphon plaque on the left but no associated stenosis. Bilateral ophthalmic and posterior communicating artery origins are normal. Patent carotid termini. Normal MCA and ACA origins. Mildly tortuous A1 segments. Diminutive or absent anterior communicating artery. Bilateral ACA branches are within normal limits. Left MCA M1 segment and left MCA trifurcation is patent without stenosis. Left MCA branches are within normal limits. Right MCA M1 segment and right MCA bifurcation are patent without stenosis. Right MCA branches are within normal limits. Venous sinuses: Patent. Anatomic variants: Fetal type bilateral PCA origins with diminutive vertebrobasilar system. Retropharyngeal carotids. Review of the MIP images confirms the above findings IMPRESSION: 1. Negative for  large vessel occlusion. 2. Normal CT appearance of the brain. 3. Minimal atherosclerosis, involving the Left ICA siphon. No arterial stenosis identified in the head or neck. 4. Carious dentition.  Thyroid goiter. Electronically Signed   By: Odessa Fleming M.D.   On: 11/17/2019 09:40   CT Angio Neck W and/or Wo Contrast  Result Date: 11/17/2019 CLINICAL DATA:  54 year old female woke at 0100 hours with right side numbness and weakness. Nausea, chest tightness. EXAM: CT ANGIOGRAPHY HEAD AND NECK TECHNIQUE: Multidetector CT imaging of the head and neck was performed using the standard protocol during bolus administration of intravenous contrast. Multiplanar CT image reconstructions and MIPs were obtained to evaluate the vascular anatomy. Carotid stenosis measurements (when applicable) are obtained utilizing NASCET criteria, using the distal internal carotid diameter as the denominator. CONTRAST:  OMNIPAQUE IOHEXOL 350 MG/ML SOLN COMPARISON:  Report of brain MRI 10/20/1999 (no images available). FINDINGS: CT HEAD Brain:  Normal cerebral volume. No midline shift, ventriculomegaly, mass effect, evidence of mass lesion, intracranial hemorrhage or evidence of cortically based acute infarction. Gray-white matter differentiation is within normal limits throughout the brain. Calvarium and skull base: Negative. Paranasal sinuses: Visualized paranasal sinuses and mastoids are clear. Orbits: Visualized orbits and scalp soft tissues are within normal limits. CTA NECK Skeleton: Carious dentition. Partially visible thoracic scoliosis. No acute osseous abnormality identified. Upper chest: Negative. Other neck: Generalized thyroid enlargement, with a 12 mm right lobe nodule which does not meet criteria for follow-up. No follow-up indicated. (Ref: J Am Coll Radiol. 2015 Feb;12(2): 143-50). Partially retropharyngeal course of both carotids. Otherwise negative neck soft tissues. Aortic arch: 3 vessel arch configuration.  No arch  atherosclerosis. Right carotid system: Tortuous brachiocephalic artery and proximal right CCA without plaque or stenosis. Retropharyngeal carotid bifurcation without plaque or stenosis. Left carotid system: Tortuous proximal left CCA and retropharyngeal left carotid bifurcation. No plaque or stenosis. Vertebral arteries: Tortuous but otherwise normal proximal right subclavian artery with normal right vertebral artery origin. However, the right vertebral is diminutive throughout the neck. It remains patent to the skull base with no plaque or stenosis identified. Minimal plaque in the proximal left subclavian artery without stenosis. Diminutive left vertebral artery, also patent to the skull base with no plaque or stenosis identified. CTA HEAD Posterior circulation: Patent but diminutive distal vertebral arteries, vertebrobasilar junction and basilar artery. Patent PICA origins, AICA and SCA origins. Fetal type bilateral PCA origins. A small left P1 segment is present. Bilateral PCA branches are within normal limits. Anterior circulation: Both ICA siphons are patent. There is mild calcified siphon plaque on the left but no associated stenosis. Bilateral ophthalmic and posterior communicating artery origins are normal. Patent carotid termini. Normal MCA and ACA origins. Mildly tortuous A1 segments. Diminutive or absent anterior communicating artery. Bilateral ACA branches are within normal limits. Left MCA M1 segment and left MCA trifurcation is patent without stenosis. Left MCA branches are within normal limits. Right MCA M1 segment and right MCA bifurcation are patent without stenosis. Right MCA branches are within normal limits. Venous sinuses: Patent. Anatomic variants: Fetal type bilateral PCA origins with diminutive vertebrobasilar system. Retropharyngeal carotids. Review of the MIP images confirms the above findings IMPRESSION: 1. Negative for large vessel occlusion. 2. Normal CT appearance of the brain. 3.  Minimal atherosclerosis, involving the Left ICA siphon. No arterial stenosis identified in the head or neck. 4. Carious dentition.  Thyroid goiter. Electronically Signed   By: Odessa Fleming M.D.   On: 11/17/2019 09:40    EKG: Independently reviewed.  Sinus tach with a rate of 100. Assessment/Plan Principal Problem:   Weakness of right side of body Active Problems:   Elevated blood pressure reading without diagnosis of hypertension   Abnormal serum creatinine level   Elevated glucose Weakness of the right side of the body: Differentials include TIA/CVA Patient started on a baby aspirin along with Lipitor 80 mg for high intensity statin therapy. Neurologically patient is stable however we will request physical therapy consult and Occupational Therapy consult. MRI of the brain, 2D echo with bubble study.  Risk factor modification with weight loss and counseling upon discharge. Lipid panel, thyroid studies.  Elevated blood pressure readings without diagnosis of hypertension: Suspect patient may have primary hypertension, we will follow blood pressure readings and initiate therapy however currently we will allow for risk of hypertension. Discussed with patient about low-salt diet.  Abnormal serum creatinine: Patient has no known history of AKI  or CKD. We will follow with cautious hydration and avoid any contrast studies. We will renally dose all meds and follow resolution. Patient is advised to refrain from using any NSAIDs.  Elevated glucose level A1c.  DVT prophylaxis:  Heparin. Code Status:  Full code  Family Communication:  Daughter datevia : [phone].  Disposition Plan:  Home with home PT  Consults called:  PT  Admission status: Observation.  Status is: Observation  The patient remains OBS appropriate and will d/c before 2 midnights.  Dispo: The patient is from: Home              Anticipated d/c is to: Home              Anticipated d/c date is: 1 day               Patient currently is not medically stable to d/c.  Gertha Calkin MD [facility] Pager [phone] If 7PM-7AM, please contact night-coverage [email or web address] Password [email or web address] 11/17/2019, 5:19 PM

## 2019-11-17 NOTE — ED Notes (Signed)
Pt states her numbness is decreasing, states "both sides feel the same now".

## 2019-11-17 NOTE — ED Notes (Signed)
ED Provider at bedside. 

## 2019-11-17 NOTE — Progress Notes (Signed)
Patient without significant known PMH presenting with R-sided numbness and weakness.  Likely needs evaluation for TIA vs. CVA.  No RF but convincing exam on presentation.  R-sided weakness on arrival, improved with now mild RLE weakness.  Also R visual abnormalities.  Dr. Otelia Limes recommends admission.  Will place in telemetry observation for now.  Georgana Curio, M.D.

## 2019-11-17 NOTE — ED Notes (Signed)
Report called to [facility], Charity fundraiser at [facility].

## 2019-11-18 ENCOUNTER — Observation Stay (HOSPITAL_BASED_OUTPATIENT_CLINIC_OR_DEPARTMENT_OTHER): Payer: Self-pay

## 2019-11-18 ENCOUNTER — Other Ambulatory Visit (HOSPITAL_COMMUNITY): Payer: Self-pay | Admitting: Internal Medicine

## 2019-11-18 DIAGNOSIS — G459 Transient cerebral ischemic attack, unspecified: Secondary | ICD-10-CM

## 2019-11-18 LAB — COMPREHENSIVE METABOLIC PANEL
ALT: 17 U/L (ref 0–44)
AST: 15 U/L (ref 15–41)
Albumin: 3.6 g/dL (ref 3.5–5.0)
Alkaline Phosphatase: 63 U/L (ref 38–126)
Anion gap: 10 (ref 5–15)
BUN: 14 mg/dL (ref 6–20)
CO2: 26 mmol/L (ref 22–32)
Calcium: 9.6 mg/dL (ref 8.9–10.3)
Chloride: 105 mmol/L (ref 98–111)
Creatinine, Ser: 0.94 mg/dL (ref 0.44–1.00)
GFR calc Af Amer: >60 mL/min (ref >60)
GFR calc non Af Amer: >60 mL/min (ref >60)
Glucose, Bld: 141 mg/dL — ABNORMAL HIGH (ref 70–99)
Potassium: 3.6 mmol/L (ref 3.5–5.1)
Sodium: 141 mmol/L (ref 135–145)
Total Bilirubin: 0.3 mg/dL (ref 0.3–1.2)
Total Protein: 6.6 g/dL (ref 6.5–8.1)

## 2019-11-18 LAB — CBC WITH DIFFERENTIAL/PLATELET
Abs Immature Granulocytes: 0.01 10*3/uL (ref 0.00–0.07)
Basophils Absolute: 0 10*3/uL (ref 0.0–0.1)
Basophils Relative: 1 %
Eosinophils Absolute: 0.2 10*3/uL (ref 0.0–0.5)
Eosinophils Relative: 2 %
HCT: 42.3 % (ref 36.0–46.0)
Hemoglobin: 13.4 g/dL (ref 12.0–15.0)
Immature Granulocytes: 0 %
Lymphocytes Relative: 37 %
Lymphs Abs: 2.3 10*3/uL (ref 0.7–4.0)
MCH: 26.7 pg (ref 26.0–34.0)
MCHC: 31.7 g/dL (ref 30.0–36.0)
MCV: 84.4 fL (ref 80.0–100.0)
Monocytes Absolute: 0.5 10*3/uL (ref 0.1–1.0)
Monocytes Relative: 7 %
Neutro Abs: 3.3 10*3/uL (ref 1.7–7.7)
Neutrophils Relative %: 53 %
Platelets: 312 10*3/uL (ref 150–400)
RBC: 5.01 MIL/uL (ref 3.87–5.11)
RDW: 14.8 % (ref 11.5–15.5)
WBC: 6.2 10*3/uL (ref 4.0–10.5)
nRBC: 0 % (ref 0.0–0.2)

## 2019-11-18 LAB — ECHOCARDIOGRAM COMPLETE BUBBLE STUDY
Area-P 1/2: 3.23 cm2
S' Lateral: 2.88 cm

## 2019-11-18 MED ORDER — ASPIRIN 81 MG PO CHEW: 81.0000 mg | CHEWABLE_TABLET | Freq: Every day | ORAL | Status: DC

## 2019-11-18 MED ORDER — ADULT MULTIVITAMIN W/MINERALS CH: 1.0000 | ORAL_TABLET | Freq: Every day | ORAL | Status: DC

## 2019-11-18 MED ORDER — ATORVASTATIN CALCIUM 80 MG PO TABS
80.0000 mg | ORAL_TABLET | Freq: Every day | ORAL | 0 refills | Status: DC
Start: 2019-11-19 — End: 2020-03-16

## 2019-11-18 MED FILL — ATORVASTATIN CALCIUM 80 MG: 80 | 30 days supply | Qty: 30 | Fill #0

## 2019-11-18 NOTE — Progress Notes (Signed)
PT Cancellation Note  Patient Details Name: Michelle Yang MRN: [id number] DOB: 08-Dec-1965   Cancelled Treatment:    Reason Eval/Treat Not Completed: PT screened, no needs identified, will sign off  Amica Harron B. Beverely Risen PT, DPT Acute Rehabilitation Services Pager [phone] Office [phone]  Elon Alas Fleet 11/18/2019, 9:07 AM

## 2019-11-18 NOTE — Progress Notes (Signed)
RN gave pt discharge instructions and the patient stated understanding. IV has been removed and the pt is dressed and is calling her ride.

## 2019-11-18 NOTE — Evaluation (Signed)
Occupational Therapy Evaluation Patient Details Name: Michelle Yang MRN: [id number] DOB: [birth year] Today's Date: 11/18/2019    History of Present Illness Pt is a 54 y/o female with no PMH, presenting with R sided weakness/numbness. CT/MRI negative.     Clinical Impression   PTA patient independent and working, admitted for above and presenting at baseline independent level for ADLs, mobility and transfers. No deficits seen in vision, cognition, strength, sensation or balance.   Patient educated on BE FAST for signs and symptoms of CVA.  Based on performance today, no further OT needs have been identified and OT will sign off.     Follow Up Recommendations  No OT follow up    Equipment Recommendations  None recommended by OT    Recommendations for Other Services       Precautions / Restrictions Restrictions Weight Bearing Restrictions: No      Mobility Bed Mobility Overal bed mobility: Modified Independent             General bed mobility comments: HOB elevated but no assist required  Transfers Overall transfer level: Independent                    Balance Overall balance assessment: No apparent balance deficits (not formally assessed)                                         ADL either performed or assessed with clinical judgement   ADL Overall ADL's : Independent                                       General ADL Comments: independent for LB dressing, simulated shower transfesr and LB bathing, grooming at sink      Vision Baseline Vision/History: Wears glasses Wears Glasses: Reading only Patient Visual Report: No change from baseline Vision Assessment?: No apparent visual deficits Additional Comments: pt reports inital changes in R vision but back to baseline, brief visual testing done with no deficits and [facility]      Perception     Praxis      Pertinent Vitals/Pain Pain Assessment: No/denies pain      Hand Dominance Right   Extremity/Trunk Assessment Upper Extremity Assessment Upper Extremity Assessment: Overall WFL for tasks assessed   Lower Extremity Assessment Lower Extremity Assessment: Overall WFL for tasks assessed   Cervical / Trunk Assessment Cervical / Trunk Assessment: Normal   Communication Communication Communication: No difficulties   Cognition Arousal/Alertness: Awake/alert Behavior During Therapy: WFL for tasks assessed/performed Overall Cognitive Status: Within Functional Limits for tasks assessed                                     General Comments  VSS, educated on BEFAST     Exercises     Shoulder Instructions      Home Living Family/patient expects to be discharged to:: Private residence Living Arrangements: Other (Comment) (brother ) Available Help at Discharge: Family;Available PRN/intermittently Type of Home: House Home Access: Level entry     Home Layout: One level     Bathroom Shower/Tub: Producer, television/film/video: Standard     Home Equipment: Grab bars - tub/shower  Prior Functioning/Environment Level of Independence: Independent        Comments: independent and working at [employer] [facility] (retired from [employer] 20 yrs)         OT Problem List:        OT Treatment/Interventions:      OT Goals(Current goals can be found in the care plan section) Acute Rehab OT Goals Patient Stated Goal: home OT Goal Formulation: With patient  OT Frequency:     Barriers to D/C:            Co-evaluation              AM-PAC OT "6 Clicks" Daily Activity     Outcome Measure Help from another person eating meals?: None Help from another person taking care of personal grooming?: None Help from another person toileting, which includes using toliet, bedpan, or urinal?: None Help from another person bathing (including washing, rinsing, drying)?: None Help from another person to put on and taking  off regular upper body clothing?: None Help from another person to put on and taking off regular lower body clothing?: None 6 Click Score: 24   End of Session Nurse Communication: Mobility status  Activity Tolerance: Patient tolerated treatment well Patient left: in bed;with call bell/phone within reach  OT Visit Diagnosis: Muscle weakness (generalized) (M62.81)                Time: 6237-6283 OT Time Calculation (min): 15 min Charges:  OT General Charges $OT Visit: 1 Visit OT Evaluation $OT Eval Low Complexity: 1 Low  Barry Brunner, OT Acute Rehabilitation Services Pager [phone] Office [phone]    Chancy Milroy 11/18/2019, 8:56 AM

## 2019-11-18 NOTE — Discharge Summary (Signed)
Physician Discharge Summary  Michelle Yang ZOX:[id number] DOB: 12-12-1965 DOA: 11/17/2019  PCP: Patient, No Pcp Per  Admit date: 11/17/2019 Discharge date: 11/18/2019  Admitted From: home Discharge disposition: home   Recommendations for Outpatient Follow-Up:   1. Monitor LFTs while on statin 2. Monitor HgbA1c 3. May need referral to neurology if continues to have headaches    Discharge Diagnosis:   Principal Problem:   Weakness of right side of body Active Problems:   Elevated blood pressure reading without diagnosis of hypertension   Abnormal serum creatinine level   Elevated glucose    Discharge Condition: Improved.  Diet recommendation: Low sodium, heart healthy.  Carbohydrate-modified.    Wound care: None.  Code status: Full.   History of Present Illness:   Michelle Yang is a 54 y.o. female with no diagnosed past medical history seen as direct admit at [facility] for rt sided weakness. Pt has had CTA of head and neck with no LVO. Pt does not drink or smoke.  Per patient report today at 1 AM she felt numbness affecting the right side of her face or upper extremities or right lower extremity everything on her right side it lasted a few minutes she tried to get out of bed and use the restroom.  She then returned to her bed and fell asleep at about 6 AM when she got up she still had the sensation affecting the right side she called her daughter and went to the urgent care.  From the urgent care patient had CT imaging and her symptoms resolved.  She reports that this is never happened to her again she denies any chest pain palpitations shortness of breath stomach symptoms any recent travels any rashes patient reports that she has a weak right eye and has lost her glasses and since losing her glasses her vision in her right eye has been poor.  Patient also denies any smoking history or secondhand exposure to smoking and also denies any alcohol or drugs.  She  denies any allergies and has not had preventive health maintenance as far as colonoscopy.  She does have a primary care physician.  She states that she takes [place] powders routinely for her migraines.   Hospital Course by Problem:   Weakness of the right side of the body: TIA vs complicated migraine -MRI negative -Patient started on a baby aspirin along with Lipitor 80 mg for high intensity statin therapy- LDL elevated 153 -echo: Left ventricular ejection fraction, by estimation, is 55 to 60%. The  left ventricle has normal function. The left ventricle has no regional  wall motion abnormalities. There is mild concentric left ventricular  hypertrophy. Left ventricular diastolic  parameters are consistent with Grade I diastolic dysfunction (impaired  relaxation).   Elevated blood pressure readings without diagnosis of hypertension: Dietary changes prior to medication  Abnormal serum creatinine: -resolved  Elevated glucose level A1c: 6.3 -dietary changes prior to medications    Medical Consultants:      Discharge Exam:   Vitals:   11/18/19 0805 11/18/19 1251  BP: (!) 145/87 (!) 137/96  Pulse: 81 82  Resp: 15 16  Temp: 97.7 F (36.5 C) 97.9 F (36.6 C)  SpO2: 97% 100%   Vitals:   11/17/19 2351 11/18/19 0415 11/18/19 0805 11/18/19 1251  BP: (!) 141/82 133/86 (!) 145/87 (!) 137/96  Pulse: 77 69 81 82  Resp: Temp: 97.9 F (36.6 C) 98.4 F (36.9 C)  97.7 F (36.5 C) 97.9 F (36.6 C)  TempSrc: Oral Oral Oral Oral  SpO2: 100% 98% 97% 100%  Weight:      Height:        General exam: Appears calm and comfortable.   The results of significant diagnostics from this hospitalization (including imaging, microbiology, ancillary and laboratory) are listed below for reference.     Procedures and Diagnostic Studies:   CT Angio Head W or Wo Contrast  Result Date: 11/17/2019 CLINICAL DATA:  54 year old female woke at 0100 hours with right side numbness and  weakness. Nausea, chest tightness. EXAM: CT ANGIOGRAPHY HEAD AND NECK TECHNIQUE: Multidetector CT imaging of the head and neck was performed using the standard protocol during bolus administration of intravenous contrast. Multiplanar CT image reconstructions and MIPs were obtained to evaluate the vascular anatomy. Carotid stenosis measurements (when applicable) are obtained utilizing NASCET criteria, using the distal internal carotid diameter as the denominator. CONTRAST:  OMNIPAQUE IOHEXOL 350 MG/ML SOLN COMPARISON:  Report of brain MRI 10/20/1999 (no images available). FINDINGS: CT HEAD Brain: Normal cerebral volume. No midline shift, ventriculomegaly, mass effect, evidence of mass lesion, intracranial hemorrhage or evidence of cortically based acute infarction. Gray-white matter differentiation is within normal limits throughout the brain. Calvarium and skull base: Negative. Paranasal sinuses: Visualized paranasal sinuses and mastoids are clear. Orbits: Visualized orbits and scalp soft tissues are within normal limits. CTA NECK Skeleton: Carious dentition. Partially visible thoracic scoliosis. No acute osseous abnormality identified. Upper chest: Negative. Other neck: Generalized thyroid enlargement, with a 12 mm right lobe nodule which does not meet criteria for follow-up. No follow-up indicated. (Ref: J Am Coll Radiol. 2015 Feb;12(2): 143-50). Partially retropharyngeal course of both carotids. Otherwise negative neck soft tissues. Aortic arch: 3 vessel arch configuration.  No arch atherosclerosis. Right carotid system: Tortuous brachiocephalic artery and proximal right CCA without plaque or stenosis. Retropharyngeal carotid bifurcation without plaque or stenosis. Left carotid system: Tortuous proximal left CCA and retropharyngeal left carotid bifurcation. No plaque or stenosis. Vertebral arteries: Tortuous but otherwise normal proximal right subclavian artery with normal right vertebral artery origin.  However, the right vertebral is diminutive throughout the neck. It remains patent to the skull base with no plaque or stenosis identified. Minimal plaque in the proximal left subclavian artery without stenosis. Diminutive left vertebral artery, also patent to the skull base with no plaque or stenosis identified. CTA HEAD Posterior circulation: Patent but diminutive distal vertebral arteries, vertebrobasilar junction and basilar artery. Patent PICA origins, AICA and SCA origins. Fetal type bilateral PCA origins. A small left P1 segment is present. Bilateral PCA branches are within normal limits. Anterior circulation: Both ICA siphons are patent. There is mild calcified siphon plaque on the left but no associated stenosis. Bilateral ophthalmic and posterior communicating artery origins are normal. Patent carotid termini. Normal MCA and ACA origins. Mildly tortuous A1 segments. Diminutive or absent anterior communicating artery. Bilateral ACA branches are within normal limits. Left MCA M1 segment and left MCA trifurcation is patent without stenosis. Left MCA branches are within normal limits. Right MCA M1 segment and right MCA bifurcation are patent without stenosis. Right MCA branches are within normal limits. Venous sinuses: Patent. Anatomic variants: Fetal type bilateral PCA origins with diminutive vertebrobasilar system. Retropharyngeal carotids. Review of the MIP images confirms the above findings IMPRESSION: 1. Negative for large vessel occlusion. 2. Normal CT appearance of the brain. 3. Minimal atherosclerosis, involving the Left ICA siphon. No arterial stenosis identified in the head or neck. 4.  Carious dentition.  Thyroid goiter. Electronically Signed   By: Odessa Fleming M.D.   On: 11/17/2019 09:40   DG Chest 2 View  Result Date: 11/17/2019 CLINICAL DATA:  Hypertension.  Right-sided numbness, now resolved. EXAM: CHEST - 2 VIEW COMPARISON:  Chest radiograph 01/17/2017 FINDINGS: Improved lung volumes from prior  exam. Heart is normal in size. Normal mediastinal contours. No pulmonary edema, focal airspace disease, pleural effusion or pneumothorax. No acute osseous abnormalities are seen. IMPRESSION: No acute chest findings. Electronically Signed   By: Narda Rutherford M.D.   On: 11/17/2019 19:43   CT Angio Neck W and/or Wo Contrast  Result Date: 11/17/2019 CLINICAL DATA:  54 year old female woke at 0100 hours with right side numbness and weakness. Nausea, chest tightness. EXAM: CT ANGIOGRAPHY HEAD AND NECK TECHNIQUE: Multidetector CT imaging of the head and neck was performed using the standard protocol during bolus administration of intravenous contrast. Multiplanar CT image reconstructions and MIPs were obtained to evaluate the vascular anatomy. Carotid stenosis measurements (when applicable) are obtained utilizing NASCET criteria, using the distal internal carotid diameter as the denominator. CONTRAST:  OMNIPAQUE IOHEXOL 350 MG/ML SOLN COMPARISON:  Report of brain MRI 10/20/1999 (no images available). FINDINGS: CT HEAD Brain: Normal cerebral volume. No midline shift, ventriculomegaly, mass effect, evidence of mass lesion, intracranial hemorrhage or evidence of cortically based acute infarction. Gray-white matter differentiation is within normal limits throughout the brain. Calvarium and skull base: Negative. Paranasal sinuses: Visualized paranasal sinuses and mastoids are clear. Orbits: Visualized orbits and scalp soft tissues are within normal limits. CTA NECK Skeleton: Carious dentition. Partially visible thoracic scoliosis. No acute osseous abnormality identified. Upper chest: Negative. Other neck: Generalized thyroid enlargement, with a 12 mm right lobe nodule which does not meet criteria for follow-up. No follow-up indicated. (Ref: J Am Coll Radiol. 2015 Feb;12(2): 143-50). Partially retropharyngeal course of both carotids. Otherwise negative neck soft tissues. Aortic arch: 3 vessel arch configuration.  No  arch atherosclerosis. Right carotid system: Tortuous brachiocephalic artery and proximal right CCA without plaque or stenosis. Retropharyngeal carotid bifurcation without plaque or stenosis. Left carotid system: Tortuous proximal left CCA and retropharyngeal left carotid bifurcation. No plaque or stenosis. Vertebral arteries: Tortuous but otherwise normal proximal right subclavian artery with normal right vertebral artery origin. However, the right vertebral is diminutive throughout the neck. It remains patent to the skull base with no plaque or stenosis identified. Minimal plaque in the proximal left subclavian artery without stenosis. Diminutive left vertebral artery, also patent to the skull base with no plaque or stenosis identified. CTA HEAD Posterior circulation: Patent but diminutive distal vertebral arteries, vertebrobasilar junction and basilar artery. Patent PICA origins, AICA and SCA origins. Fetal type bilateral PCA origins. A small left P1 segment is present. Bilateral PCA branches are within normal limits. Anterior circulation: Both ICA siphons are patent. There is mild calcified siphon plaque on the left but no associated stenosis. Bilateral ophthalmic and posterior communicating artery origins are normal. Patent carotid termini. Normal MCA and ACA origins. Mildly tortuous A1 segments. Diminutive or absent anterior communicating artery. Bilateral ACA branches are within normal limits. Left MCA M1 segment and left MCA trifurcation is patent without stenosis. Left MCA branches are within normal limits. Right MCA M1 segment and right MCA bifurcation are patent without stenosis. Right MCA branches are within normal limits. Venous sinuses: Patent. Anatomic variants: Fetal type bilateral PCA origins with diminutive vertebrobasilar system. Retropharyngeal carotids. Review of the MIP images confirms the above findings IMPRESSION: 1. Negative for  large vessel occlusion. 2. Normal CT appearance of the brain. 3.  Minimal atherosclerosis, involving the Left ICA siphon. No arterial stenosis identified in the head or neck. 4. Carious dentition.  Thyroid goiter. Electronically Signed   By: Odessa Fleming M.D.   On: 11/17/2019 09:40   MR BRAIN WO CONTRAST  Result Date: 11/17/2019 CLINICAL DATA:  Acute presentation with right-sided weakness. Negative acute CT evaluation. EXAM: MRI HEAD WITHOUT CONTRAST TECHNIQUE: Multiplanar, multiecho pulse sequences of the brain and surrounding structures were obtained without intravenous contrast. COMPARISON:  CT studies same day. FINDINGS: Brain: The brain has a normal appearance without evidence of malformation, atrophy, old or acute small or large vessel infarction, mass lesion, hemorrhage, hydrocephalus or extra-axial collection. Vascular: Major vessels at the base of the brain show flow. Venous sinuses appear patent. Skull and upper cervical spine: Normal. Sinuses/Orbits: Clear/normal. Other: None significant. IMPRESSION: Normal examination.  No cause of right-sided weakness identified. Electronically Signed   By: Paulina Fusi M.D.   On: 11/17/2019 19:24   ECHOCARDIOGRAM COMPLETE BUBBLE STUDY  Result Date: 11/18/2019    ECHOCARDIOGRAM REPORT   Patient Name:   [facility] A [place] Date of Exam: 11/18/2019 Medical Rec #:  [id number]            Height:       64.0 in Accession #:    [id number]           Weight:       211.0 lb Date of Birth:  1966/01/12           BSA:          2.001 m Patient Age:    53 years             BP:           137/96 mmHg Patient Gender: F                    HR:           82 bpm. Exam Location:  Inpatient Procedure: 2D Echo and Saline Contrast Bubble Study Indications:    TIA G45.9  History:        Patient has no prior history of Echocardiogram examinations.  Sonographer:    Thurman Coyer RDCS (AE) Referring Phys: [phone] EKTA V PATEL IMPRESSIONS  1. Left ventricular ejection fraction, by estimation, is 55 to 60%. The left ventricle has normal function. The left  ventricle has no regional wall motion abnormalities. There is mild concentric left ventricular hypertrophy. Left ventricular diastolic parameters are consistent with Grade I diastolic dysfunction (impaired relaxation).  2. Right ventricular systolic function is normal. The right ventricular size is normal. Tricuspid regurgitation signal is inadequate for assessing PA pressure.  3. The mitral valve is normal in structure. No evidence of mitral valve regurgitation. No evidence of mitral stenosis.  4. The aortic valve is tricuspid. Aortic valve regurgitation is not visualized. No aortic stenosis is present.  5. The inferior vena cava is normal in size with greater than 50% respiratory variability, suggesting right atrial pressure of 3 mmHg.  6. Agitated saline contrast bubble study was negative, with no evidence of any interatrial shunt. FINDINGS  Left Ventricle: Left ventricular ejection fraction, by estimation, is 55 to 60%. The left ventricle has normal function. The left ventricle has no regional wall motion abnormalities. The left ventricular internal cavity size was normal in size. There is  mild concentric left ventricular hypertrophy. Left ventricular diastolic parameters are consistent with  Grade I diastolic dysfunction (impaired relaxation). Indeterminate filling pressures. Right Ventricle: The right ventricular size is normal. No increase in right ventricular wall thickness. Right ventricular systolic function is normal. Tricuspid regurgitation signal is inadequate for assessing PA pressure. Left Atrium: Left atrial size was normal in size. Right Atrium: Right atrial size was normal in size. Pericardium: There is no evidence of pericardial effusion. Mitral Valve: The mitral valve is normal in structure. No evidence of mitral valve regurgitation. No evidence of mitral valve stenosis. Tricuspid Valve: The tricuspid valve is normal in structure. Tricuspid valve regurgitation is not demonstrated. No evidence of  tricuspid stenosis. Aortic Valve: The aortic valve is tricuspid. Aortic valve regurgitation is not visualized. No aortic stenosis is present. Pulmonic Valve: The pulmonic valve was normal in structure. Pulmonic valve regurgitation is not visualized. No evidence of pulmonic stenosis. Aorta: The aortic root is normal in size and structure. Venous: The inferior vena cava is normal in size with greater than 50% respiratory variability, suggesting right atrial pressure of 3 mmHg. IAS/Shunts: No atrial level shunt detected by color flow Doppler. Agitated saline contrast was given intravenously to evaluate for intracardiac shunting. Agitated saline contrast bubble study was negative, with no evidence of any interatrial shunt.  LEFT VENTRICLE PLAX 2D LVIDd:         3.93 cm  Diastology LVIDs:         2.88 cm  LV e' medial:    6.09 cm/s LV PW:         1.17 cm  LV E/e' medial:  9.3 LV IVS:        1.18 cm  LV e' lateral:   5.55 cm/s LVOT diam:     2.00 cm  LV E/e' lateral: 10.2 LV SV:         44 LV SV Index:   22 LVOT Area:     3.14 cm  RIGHT VENTRICLE RV S prime:     8.81 cm/s TAPSE (M-mode): 1.6 cm LEFT ATRIUM             Index LA diam:        3.30 cm 1.65 cm/m LA Vol (A2C):   45.4 ml 22.68 ml/m LA Vol (A4C):   32.7 ml 16.34 ml/m LA Biplane Vol: 40.4 ml 20.19 ml/m  AORTIC VALVE LVOT Vmax:   73.80 cm/s LVOT Vmean:  50.000 cm/s LVOT VTI:    0.141 m  AORTA Ao Asc diam: 3.20 cm MITRAL VALVE MV Area (PHT): 3.23 cm    SHUNTS MV Decel Time: 235 msec    Systemic VTI:  0.14 m MV E velocity: 56.60 cm/s  Systemic Diam: 2.00 cm MV A velocity: 68.60 cm/s MV E/A ratio:  0.83 Chilton Si MD Electronically signed by Chilton Si MD Signature Date/Time: 11/18/2019/2:26:37 PM    Final      Labs:   Basic Metabolic Panel: Recent Labs  Lab 11/17/19 0900 11/18/19 0312  NA 141 141  K 4.0 3.6  CL 102 105  CO2 27 26  GLUCOSE 158* 141*  BUN 9 14  CREATININE 1.11* 0.94  CALCIUM 9.5 9.6   GFR Estimated Creatinine  Clearance: 77.7 mL/min (by C-G formula based on SCr of 0.94 mg/dL). Liver Function Tests: Recent Labs  Lab 11/17/19 0900 11/18/19 0312  AST 20 15  ALT 20 17  ALKPHOS 71 63  BILITOT 0.2* 0.3  PROT 7.2 6.6  ALBUMIN 4.0 3.6   No results for input(s): LIPASE, AMYLASE in the last 168 hours.  No results for input(s): AMMONIA in the last 168 hours. Coagulation profile Recent Labs  Lab 11/17/19 0900  INR 1.0    CBC: Recent Labs  Lab 11/17/19 0900 11/18/19 0312  WBC 5.3 6.2  NEUTROABS 3.2 3.3  HGB 14.0 13.4  HCT 43.9 42.3  MCV 83.3 84.4  PLT 314 312   Cardiac Enzymes: No results for input(s): CKTOTAL, CKMB, CKMBINDEX, TROPONINI in the last 168 hours. BNP: Invalid input(s): POCBNP CBG: Recent Labs  Lab 11/17/19 0857  GLUCAP 151*   D-Dimer No results for input(s): DDIMER in the last 72 hours. Hgb A1c Recent Labs    11/17/19 1615  HGBA1C 6.3*   Lipid Profile Recent Labs    11/17/19 1615  CHOL 222*  HDL 50  LDLCALC 153*  TRIG 96  CHOLHDL 4.4   Thyroid function studies Recent Labs    11/17/19 1615  TSH 0.601   Anemia work up Recent Labs    11/17/19 1615  VITAMINB12 856  FOLATE 7.4  FERRITIN 73  TIBC 400  IRON 61  RETICCTPCT 1.3   Microbiology Recent Results (from the past 240 hour(s))  Respiratory Panel by RT PCR (Flu A&B, Covid) - Nasopharyngeal Swab     Status: None   Collection Time: 11/17/19 11:08 AM   Specimen: Nasopharyngeal Swab  Result Value Ref Range Status   SARS Coronavirus 2 by RT PCR NEGATIVE NEGATIVE Final    Comment: (NOTE) SARS-CoV-2 target nucleic acids are NOT DETECTED.  The SARS-CoV-2 RNA is generally detectable in upper respiratoy specimens during the acute phase of infection. The lowest concentration of SARS-CoV-2 viral copies this assay can detect is 131 copies/mL. A negative result does not preclude SARS-Cov-2 infection and should not be used as the sole basis for treatment or other patient management decisions. A  negative result may occur with  improper specimen collection/handling, submission of specimen other than nasopharyngeal swab, presence of viral mutation(s) within the areas targeted by this assay, and inadequate number of viral copies (<131 copies/mL). A negative result must be combined with clinical observations, patient history, and epidemiological information. The expected result is Negative.  Fact Sheet for Patients:  https://www.moore.com/https://www.fda.gov/media/142436/download  Fact Sheet for Healthcare Providers:  https://www.young.biz/https://www.fda.gov/media/142435/download  This test is no t yet approved or cleared by the [place] FDA and  has been authorized for detection and/or diagnosis of SARS-CoV-2 by FDA under an Emergency Use Authorization (EUA). This EUA will remain  in effect (meaning this test can be used) for the duration of the COVID-19 declaration under Section 564(b)(1) of the Act, 21 U.S.C. section 360bbb-3(b)(1), unless the authorization is terminated or revoked sooner.     Influenza A by PCR NEGATIVE NEGATIVE Final   Influenza B by PCR NEGATIVE NEGATIVE Final    Comment: (NOTE) The Xpert Xpress SARS-CoV-2/FLU/RSV assay is intended as an aid in  the diagnosis of influenza from Nasopharyngeal swab specimens and  should not be used as a sole basis for treatment. Nasal washings and  aspirates are unacceptable for Xpert Xpress SARS-CoV-2/FLU/RSV  testing.  Fact Sheet for Patients: https://www.moore.com/https://www.fda.gov/media/142436/download  Fact Sheet for Healthcare Providers: https://www.young.biz/https://www.fda.gov/media/142435/download  This test is not yet approved or cleared by the [place] FDA and  has been authorized for detection and/or diagnosis of SARS-CoV-2 by  FDA under an Emergency Use Authorization (EUA). This EUA will remain  in effect (meaning this test can be used) for the duration of the  Covid-19 declaration under Section 564(b)(1) of the Act, 21  U.S.C. section 360bbb-3(b)(1),  unless the authorization  is  terminated or revoked. Performed at [facility], [address] [place], [place] [address]      Discharge Instructions:   Discharge Instructions    Diet - low sodium heart healthy   Complete by: As directed    Diet Carb Modified   Complete by: As directed    Discharge instructions   Complete by: As directed    Your LDL (bad cholesterol was 153)-- goal is <70 Your HgbA1c (average of blood sugars over 3 months) is 6.3-- > 6.4 is considered diabetic-- would start a low card diet and exercise   Increase activity slowly   Complete by: As directed      Allergies as of 11/18/2019   No Known Allergies     Medication List    STOP taking these medications   Goodys Extra Strength 500-325-65 MG Pack Generic drug: Aspirin-Acetaminophen-Caffeine     TAKE these medications   aspirin 81 MG chewable tablet Chew 1 tablet (81 mg total) by mouth daily. Start taking on: November 19, 2019   atorvastatin 80 MG tablet Commonly known as: LIPITOR Take 1 tablet (80 mg total) by mouth daily. Start taking on: November 19, 2019   multivitamin with minerals Tabs tablet Take 1 tablet by mouth at bedtime.         Time coordinating discharge: 25 min  Signed:  Joseph Art DO  [facility] 11/18/2019, 3:24 PM

## 2019-11-18 NOTE — Progress Notes (Signed)
  Echocardiogram 2D Echocardiogram has been performed.  Michelle Yang 11/18/2019, 2:10 PM

## 2019-11-21 ENCOUNTER — Ambulatory Visit: Payer: Self-pay | Attending: Internal Medicine

## 2019-11-21 ENCOUNTER — Other Ambulatory Visit (HOSPITAL_BASED_OUTPATIENT_CLINIC_OR_DEPARTMENT_OTHER): Payer: Self-pay | Admitting: Internal Medicine

## 2019-11-21 ENCOUNTER — Ambulatory Visit: Payer: Self-pay

## 2019-11-21 DIAGNOSIS — Z23 Encounter for immunization: Secondary | ICD-10-CM

## 2019-11-21 NOTE — Progress Notes (Signed)
   Covid-19 Vaccination Clinic  Name:  Michelle Yang    MRN: [id number] DOB: 1965-12-17  11/21/2019  Ms. [name] was observed post Covid-19 immunization for 15 minutes without incident. She was provided with Vaccine Information Sheet and instruction to access the V-Safe system. Vaccinated by [employer].  Ms. [name] was instructed to call 911 with any severe reactions post vaccine: Marland Kitchen Difficulty breathing  . Swelling of face and throat  . A fast heartbeat  . A bad rash all over body  . Dizziness and weakness   Immunizations Administered    Name Date Dose VIS Date Route   Pfizer COVID-19 Vaccine 11/21/2019  3:20 PM 0.3 mL 04/09/2018 Intramuscular   Manufacturer: [employer], [employer]   Lot: [id number]   NDC: [address]-4

## 2019-11-28 MED FILL — PFIZER-BIONTECH COVID-19 VA: 30 | 1 days supply | Qty: 0 | Fill #0

## 2019-12-12 ENCOUNTER — Ambulatory Visit: Payer: Self-pay | Attending: Internal Medicine

## 2019-12-12 ENCOUNTER — Other Ambulatory Visit (HOSPITAL_BASED_OUTPATIENT_CLINIC_OR_DEPARTMENT_OTHER): Payer: Self-pay | Admitting: Internal Medicine

## 2019-12-12 DIAGNOSIS — Z23 Encounter for immunization: Secondary | ICD-10-CM

## 2019-12-15 NOTE — Progress Notes (Signed)
   Covid-19 Vaccination Clinic  Name:  Michelle Yang    MRN: [id number] DOB: 1966-01-12  12/15/2019  [name] was observed post Covid-19 immunization for 15 minutes without incident. She was provided with Vaccine Information Sheet and instruction to access the V-Safe system.   [name] was instructed to call 911 with any severe reactions post vaccine: Marland Kitchen Difficulty breathing  . Swelling of face and throat  . A fast heartbeat  . A bad rash all over body  . Dizziness and weakness   Immunizations Administered    Name Date Dose VIS Date Route   Pfizer COVID-19 Vaccine 12/12/2019  2:33 PM 0.3 mL 12/03/2019 Intramuscular   Manufacturer: [employer], [employer]   Lot: [id number]   NDC: [address]-3

## 2019-12-19 ENCOUNTER — Other Ambulatory Visit (HOSPITAL_BASED_OUTPATIENT_CLINIC_OR_DEPARTMENT_OTHER): Payer: Self-pay | Admitting: Internal Medicine

## 2019-12-19 MED FILL — FLUARIX QUADRIVALENT 0.5 ML: 0.5 | 1 days supply | Qty: 1 | Fill #0

## 2019-12-19 MED FILL — PFIZER-BIONTECH COVID-19 VA: 30 | 1 days supply | Qty: 0 | Fill #0

## 2020-03-16 ENCOUNTER — Other Ambulatory Visit: Payer: Self-pay

## 2020-03-16 ENCOUNTER — Ambulatory Visit
Admission: EM | Admit: 2020-03-16 | Discharge: 2020-03-16 | Disposition: A | Discharge status: Home / Self Care | Payer: Self-pay | Attending: Emergency Medicine | Admitting: Emergency Medicine

## 2020-03-16 DIAGNOSIS — R21 Rash and other nonspecific skin eruption: Secondary | ICD-10-CM

## 2020-03-16 DIAGNOSIS — R220 Localized swelling, mass and lump, head: Secondary | ICD-10-CM

## 2020-03-16 HISTORY — DX: Cerebral infarction, unspecified: I63.9

## 2020-03-16 MED ORDER — PREDNISONE 10 MG PO TABS: ORAL_TABLET | ORAL | 0 refills | Status: DC

## 2020-03-16 MED ORDER — AMLODIPINE BESYLATE 5 MG PO TABS: 5.0000 mg | ORAL_TABLET | Freq: Every day | ORAL | 0 refills | Status: DC

## 2020-03-16 NOTE — Discharge Instructions (Addendum)
Rash-begin prednisone taper over the next 6 days-begin with 6 tablets, decrease by 1 tablet each day until complete-6, 5, 4, 3, 2, 1-take with food in the morning if you are able May also use daily cetirizine/Zyrtec or already/Claritin for the help with itching, supplement Benadryl at bedtime Monitor for rash to resolve over the next week  Hypertension-read attached on DASH diet and hypertension Begin amlodipine daily Monitor blood pressure Set up follow-up appointment with primary care in the next 2 weeks Please go to emergency room if developing any headache, vision changes, difficulty speaking, facial drooping, one-sided weakness, chest pain

## 2020-03-16 NOTE — ED Provider Notes (Signed)
[name]    CSN: [id number] Arrival date & time: 03/16/20  1714      History   Chief Complaint Chief Complaint  Patient presents with  . Rash    HPI Michelle Yang is a 55 y.o. female presenting today for evaluation of a rash.  Reports that over the past 3 days she has developed a rash around her mouth lips and has noticed some lip swelling.  Rash associated with itching.  Reports approximately 1 week ago she thought her lips were chapped and was using Blistex/Carmex to help.  She has previously used these 2 products without any problems.  She stopped using these as they were not helping with her symptoms prior to onset of rash.  She denies any other new foods medicines, make-up or hygiene products.  She denies any difficulty breathing or shortness of breath.  Rash is only on perioral area.  Took some Benadryl last night.  HPI  Past Medical History:  Diagnosis Date  . Medical history non-contributory   . Stroke [facility])     Patient Active Problem List   Diagnosis Date Noted  . TIA (transient ischemic attack) 11/17/2019  . Weakness of right side of body 11/17/2019  . Elevated blood pressure reading without diagnosis of hypertension 11/17/2019  . Abnormal serum creatinine level 11/17/2019  . Elevated glucose 11/17/2019  . 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 ANKLE FRACTURE SURGERY Left     OB History    Gravida  3   Para  3   Term  3   Preterm      AB      Living  3     SAB      IAB      Ectopic      Multiple      Live Births  3            Home Medications    Prior to Admission medications   Medication Sig Start Date End Date Taking? Authorizing Provider  amLODipine (NORVASC) 5 MG tablet Take 1 tablet (5 mg total) by mouth daily. 03/16/20  Yes Dorri Ozturk C, PA-C  predniSONE (DELTASONE) 10 MG tablet Begin with 6 tabs on day 1,  5 tab on day 2, 4 tab on day 3, 3 tab on day 4, 2 tab on day 5, 1 tab on day 6-take with food 03/16/20  Yes Eriana Suliman, [place] C, PA-C    Family History Family History  Problem Relation Age of Onset  . Diabetes Father     Social History Social History   Tobacco Use  . Smoking status: Never Smoker  . Smokeless tobacco: Never Used  Substance Use Topics  . Alcohol use: No  . Drug use: No     Allergies   Patient has no known allergies.   Review of Systems Review of Systems  Constitutional: Negative for fatigue and fever.  HENT: Negative for mouth sores.   Eyes: Negative for visual disturbance.  Respiratory: Negative for shortness of breath.   Cardiovascular: Negative for chest pain.  Gastrointestinal: Negative for abdominal pain, nausea and vomiting.  Genitourinary: Negative for genital sores.  Musculoskeletal: Negative for arthralgias and joint swelling.  Skin: Positive for color change and rash. Negative for wound.  Neurological: Negative for dizziness, weakness, light-headedness and headaches.     Physical Exam Triage Vital Signs ED Triage Vitals  Enc  Vitals Group     BP      Pulse      Resp      Temp      Temp src      SpO2      Weight      Height      Head Circumference      Peak Flow      Pain Score      Pain Loc      Pain Edu?      Excl. in GC?    No data found.  Updated Vital Signs BP (!) 176/115 (BP Location: Left Arm)   Pulse 92   Temp 98.3 F (36.8 C) (Oral)   Resp 18   LMP 01/13/2017 Comment: saturday  SpO2 94%   Visual Acuity Right Eye Distance:   Left Eye Distance:   Bilateral Distance:    Right Eye Near:   Left Eye Near:    Bilateral Near:     Physical Exam Vitals and nursing note reviewed.  Constitutional:      Appearance: She is well-developed and well-nourished.     Comments: No acute distress  HENT:     Head: Normocephalic and atraumatic.     Nose: Nose normal.     Mouth/Throat:     Comments: Perioral area with mild  erythematous papular lesions, no vesicles sores or crusting noted, mild lip swelling, no lesions noted on oral mucosa, no soft palate swelling, posterior pharynx patent, uvula midline Eyes:     Conjunctiva/sclera: Conjunctivae normal.  Cardiovascular:     Rate and Rhythm: Normal rate.  Pulmonary:     Effort: Pulmonary effort is normal. No respiratory distress.     Comments: Breathing comfortably at rest, CTABL, no wheezing, rales or other adventitious sounds auscultated  Abdominal:     General: There is no distension.  Musculoskeletal:        General: Normal range of motion.     Cervical back: Neck supple.  Skin:    General: Skin is warm and dry.     Comments: No rash noted elsewhere  Neurological:     Mental Status: She is alert and oriented to person, place, and time.  Psychiatric:        Mood and Affect: Mood and affect normal.      UC Treatments / Results  Labs (all labs ordered are listed, but only abnormal results are displayed) Labs Reviewed - No data to display  EKG   Radiology No results found.  Procedures Procedures (including critical care time)  Medications Ordered in UC Medications - No data to display  Initial Impression / Assessment and Plan / UC Course  I have reviewed the triage vital signs and the nursing notes.  Pertinent labs & imaging results that were available during my care of the patient were reviewed by me and considered in my medical decision making (see chart for details).  Clinical Course as of 03/17/20 1021  Tue Mar 16, 2020  1811 168/113 Bp recheck [HW]    Clinical Course User Index [HW] Shomari Matusik C, PA-C    Rash does seem more allergic/reactive in nature, less suggestive of of infectious etiology-does not appear like HSV or impetigo at this time.  Discussed possible etiology of use of lip products, but patient reports stopping these prior to onset of rash.  Opting to proceed with treatment for allergic cause with course of  prednisone and antihistamines with close monitoring.  Avoid products in  this area temporarily.  Discussed strict return precautions. Patient verbalized understanding and is agreeable with plan.  Final Clinical Impressions(s) / UC Diagnoses   Final diagnoses:  Rash and nonspecific skin eruption  Lip swelling     Discharge Instructions     Rash-begin prednisone taper over the next 6 days-begin with 6 tablets, decrease by 1 tablet each day until complete-6, 5, 4, 3, 2, 1-take with food in the morning if you are able May also use daily cetirizine/Zyrtec or already/Claritin for the help with itching, supplement Benadryl at bedtime Monitor for rash to resolve over the next week  Hypertension-read attached on DASH diet and hypertension Begin amlodipine daily Monitor blood pressure Set up follow-up appointment with primary care in the next 2 weeks Please go to emergency room if developing any headache, vision changes, difficulty speaking, facial drooping, one-sided weakness, chest pain    ED Prescriptions    Medication Sig Dispense Auth. Provider   amLODipine (NORVASC) 5 MG tablet Take 1 tablet (5 mg total) by mouth daily. 60 tablet Brealyn Baril C, PA-C   predniSONE (DELTASONE) 10 MG tablet Begin with 6 tabs on day 1, 5 tab on day 2, 4 tab on day 3, 3 tab on day 4, 2 tab on day 5, 1 tab on day 6-take with food 21 tablet Vanecia Limpert C, PA-C     PDMP not reviewed this encounter.   Wilhelmena Zea, [place] C, PA-C 03/17/20 1021

## 2020-03-16 NOTE — ED Triage Notes (Signed)
Pt c/o chapped lips last week and was using chap stick then developed rash around her mouth that has now worsen.

## 2020-06-30 ENCOUNTER — Ambulatory Visit: Admission: EM | Admit: 2020-06-30 | Discharge: 2020-06-30 | Discharge status: Absent without leave | Payer: Self-pay

## 2020-06-30 ENCOUNTER — Ambulatory Visit
Admission: EM | Admit: 2020-06-30 | Discharge: 2020-06-30 | Disposition: A | Discharge status: Home / Self Care | Payer: Self-pay | Attending: Emergency Medicine | Admitting: Emergency Medicine

## 2020-06-30 ENCOUNTER — Telehealth: Payer: Self-pay

## 2020-06-30 ENCOUNTER — Other Ambulatory Visit: Payer: Self-pay

## 2020-06-30 DIAGNOSIS — R21 Rash and other nonspecific skin eruption: Secondary | ICD-10-CM

## 2020-06-30 MED ORDER — PERMETHRIN 5 % EX CREA: TOPICAL_CREAM | CUTANEOUS | 0 refills | Status: DC

## 2020-06-30 MED ORDER — FAMOTIDINE 20 MG PO TABS: 20.0000 mg | ORAL_TABLET | Freq: Two times a day (BID) | ORAL | 0 refills | Status: DC

## 2020-06-30 MED ORDER — PREDNISONE 10 MG PO TABS: ORAL_TABLET | ORAL | 0 refills | Status: DC

## 2020-06-30 NOTE — Discharge Instructions (Signed)
Permethrin cream: Thoroughly massage cream (30 g for average adult) from head to soles of feet; leave on for 8 to 14 hours before removing (shower or bath); for elderly patients, also apply on the hairline, neck, scalp, temple, and forehead; may repeat if living mites are observed 14 days after first treatment; one application is generally curative.  Begin prednisone taper x6 days-begin with 6 tablets on day 1, decrease by 1 tablet each day until complete-6, 5, 4, 3, 2, 1-take with food and earlier in the day if possible  Continue antihistamines-daily cetirizine/Zyrtec or loratadine/Claritin in the morning, Benadryl in the evening, Pepcid twice daily  Follow-up if symptoms not improving or worsening

## 2020-06-30 NOTE — ED Triage Notes (Signed)
Pt c/o rash all over since Monday. States when taken benadryl it helps with itching and seen to go away but returns at night.

## 2020-07-01 NOTE — ED Provider Notes (Signed)
[name]    CSN: [id number] Arrival date & time: 06/30/20  1906      History   Chief Complaint Chief Complaint  Patient presents with  . Rash    HPI Michelle Yang is a 55 y.o. female history of prior TIA presenting today for evaluation of a rash.  Reports developed a rash 2 days ago to upper extremities trunk and neck.  Reports associated itching associated with this.  Took Benadryl with some relief.  Reports symptoms worse at nighttime.  Denies any new foods soaps lotions detergents or hygiene products.  Denies new foods or medicines.  Denies history of similar.  Denies difficulty breathing or shortness of breath.  HPI  Past Medical History:  Diagnosis Date  . Medical history non-contributory   . Stroke [facility])     Patient Active Problem List   Diagnosis Date Noted  . TIA (transient ischemic attack) 11/17/2019  . Weakness of right side of body 11/17/2019  . Elevated blood pressure reading without diagnosis of hypertension 11/17/2019  . Abnormal serum creatinine level 11/17/2019  . Elevated glucose 11/17/2019  . 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 ANKLE FRACTURE SURGERY Left     OB History    Gravida  3   Para  3   Term  3   Preterm      AB      Living  3     SAB      IAB      Ectopic      Multiple      Live Births  3            Home Medications    Prior to Admission medications   Medication Sig Start Date End Date Taking? Authorizing Provider  amLODipine (NORVASC) 5 MG tablet Take 1 tablet (5 mg total) by mouth daily. 03/16/20   Kriston Pasquarello C, PA-C  COVID-19 mRNA vaccine, Pfizer, 30 MCG/0.3ML injection INJECT AS DIRECTED 12/12/19 12/11/20  Judyann Munson, MD  COVID-19 mRNA vaccine, Pfizer, 30 MCG/0.3ML injection INJECT AS DIRECTED 11/21/19 11/20/20  Judyann Munson, MD  famotidine (PEPCID) 20 MG tablet Take 1 tablet (20  mg total) by mouth 2 (two) times daily. 06/30/20   Charlett Merkle C, PA-C  influenza vac split quadrivalent PF (FLUARIX) 0.5 ML injection INJECT AS DIRECTED 12/19/19 12/18/20  Judyann Munson, MD  permethrin (ELIMITE) 5 % cream Apply head to toe, leave on for 8 to 10 hours and rinse off in morning 06/30/20   Christi Wirick C, PA-C  predniSONE (DELTASONE) 10 MG tablet Begin with 6 tabs on day 1, 5 tab on day 2, 4 tab on day 3, 3 tab on day 4, 2 tab on day 5, 1 tab on day 6-take with food 06/30/20   Taino Maertens C, PA-C  atorvastatin (LIPITOR) 80 MG tablet Take 1 tablet (80 mg total) by mouth daily. 11/19/19 03/16/20  Joseph Art, DO    Family History Family History  Problem Relation Age of Onset  . Diabetes Father     Social History Social History   Tobacco Use  . Smoking status: Never Smoker  . Smokeless tobacco: Never Used  Substance Use Topics  . Alcohol use: No  . Drug use: No     Allergies   Patient has no known allergies.   Review of Systems Review of Systems  Constitutional: Negative for fatigue  and fever.  HENT: Negative for mouth sores.   Eyes: Negative for visual disturbance.  Respiratory: Negative for shortness of breath.   Cardiovascular: Negative for chest pain.  Gastrointestinal: Negative for abdominal pain, nausea and vomiting.  Genitourinary: Negative for genital sores.  Musculoskeletal: Negative for arthralgias and joint swelling.  Skin: Positive for color change and rash. Negative for wound.  Neurological: Negative for dizziness, weakness, light-headedness and headaches.     Physical Exam Triage Vital Signs ED Triage Vitals [06/30/20 2000]  Enc Vitals Group     BP (!) 150/108     Pulse Rate (!) 112     Resp 18     Temp 99.6 F (37.6 C)     Temp Source Oral     SpO2 96 %     Weight      Height      Head Circumference      Peak Flow      Pain Score 0     Pain Loc      Pain Edu?      Excl. in GC?    No data found.  Updated Vital  Signs BP (!) 150/108 (BP Location: Left Arm)   Pulse (!) 112   Temp 99.6 F (37.6 C) (Oral)   Resp 18   LMP 01/13/2017 Comment: saturday  SpO2 96%   Visual Acuity Right Eye Distance:   Left Eye Distance:   Bilateral Distance:    Right Eye Near:   Left Eye Near:    Bilateral Near:     Physical Exam Vitals and nursing note reviewed.  Constitutional:      Appearance: She is well-developed.     Comments: No acute distress  HENT:     Head: Normocephalic and atraumatic.     Nose: Nose normal.     Mouth/Throat:     Comments: Oral mucosa pink and moist, no tonsillar enlargement or exudate. Posterior pharynx patent and nonerythematous, no uvula deviation or swelling. Normal phonation.  Eyes:     Conjunctiva/sclera: Conjunctivae normal.  Cardiovascular:     Rate and Rhythm: Normal rate.  Pulmonary:     Effort: Pulmonary effort is normal. No respiratory distress.     Comments: Breathing comfortably at rest, CTABL, no wheezing, rales or other adventitious sounds auscultated Abdominal:     General: There is no distension.  Musculoskeletal:        General: Normal range of motion.     Cervical back: Neck supple.  Skin:    General: Skin is warm and dry.     Comments: Bilateral upper extremities with slightly erythematous, slightly raised small circular areas, no papules vesicles or scabbing, no lesions noted in webbing's of fingers, less diffuse on trunk, but does have more maculopapular distribution  Neurological:     Mental Status: She is alert and oriented to person, place, and time.      UC Treatments / Results  Labs (all labs ordered are listed, but only abnormal results are displayed) Labs Reviewed - No data to display  EKG   Radiology No results found.  Procedures Procedures (including critical care time)  Medications Ordered in UC Medications - No data to display  Initial Impression / Assessment and Plan / UC Course  I have reviewed the triage vital signs  and the nursing notes.  Pertinent labs & imaging results that were available during my care of the patient were reviewed by me and considered in my medical decision making (see chart  for details).     Appearance of rash mother suggestive of possible allergic reaction/urticaria, but given symptoms worse at nighttime opting to cover for scabies with permethrin and placing on course of prednisone, declined diabetes history.  Continue antihistamines as well.  Unclear trigger at this time.  Discussed strict return precautions. Patient verbalized understanding and is agreeable with plan.  Final Clinical Impressions(s) / UC Diagnoses   Final diagnoses:  Rash and nonspecific skin eruption     Discharge Instructions     Permethrin cream: Thoroughly massage cream (30 g for average adult) from head to soles of feet; leave on for 8 to 14 hours before removing (shower or bath); for elderly patients, also apply on the hairline, neck, scalp, temple, and forehead; may repeat if living mites are observed 14 days after first treatment; one application is generally curative.  Begin prednisone taper x6 days-begin with 6 tablets on day 1, decrease by 1 tablet each day until complete-6, 5, 4, 3, 2, 1-take with food and earlier in the day if possible  Continue antihistamines-daily cetirizine/Zyrtec or loratadine/Claritin in the morning, Benadryl in the evening, Pepcid twice daily  Follow-up if symptoms not improving or worsening   ED Prescriptions    Medication Sig Dispense Auth. Provider   permethrin (ELIMITE) 5 % cream Apply head to toe, leave on for 8 to 10 hours and rinse off in morning 60 g Nami Strawder C, PA-C   predniSONE (DELTASONE) 10 MG tablet Begin with 6 tabs on day 1, 5 tab on day 2, 4 tab on day 3, 3 tab on day 4, 2 tab on day 5, 1 tab on day 6-take with food 21 tablet Hallel Denherder C, PA-C   famotidine (PEPCID) 20 MG tablet Take 1 tablet (20 mg total) by mouth 2 (two) times daily. 30  tablet Carden Teel, [place] C, PA-C     PDMP not reviewed this encounter.   Sharyon Cable [place] C, [place] 07/01/20 [phone]

## 2020-07-02 ENCOUNTER — Encounter (HOSPITAL_COMMUNITY): Payer: Self-pay | Admitting: *Deleted

## 2020-07-02 ENCOUNTER — Emergency Department (HOSPITAL_COMMUNITY)
Admission: EM | Admit: 2020-07-02 | Discharge: 2020-07-02 | Disposition: A | Discharge status: Home / Self Care | Payer: Self-pay | Attending: Emergency Medicine | Admitting: Emergency Medicine

## 2020-07-02 ENCOUNTER — Other Ambulatory Visit: Payer: Self-pay

## 2020-07-02 DIAGNOSIS — R000 Tachycardia, unspecified: Secondary | ICD-10-CM | POA: Insufficient documentation

## 2020-07-02 DIAGNOSIS — Z8673 Personal history of transient ischemic attack (TIA), and cerebral infarction without residual deficits: Secondary | ICD-10-CM | POA: Insufficient documentation

## 2020-07-02 DIAGNOSIS — Z79899 Other long term (current) drug therapy: Secondary | ICD-10-CM | POA: Insufficient documentation

## 2020-07-02 DIAGNOSIS — R21 Rash and other nonspecific skin eruption: Secondary | ICD-10-CM | POA: Insufficient documentation

## 2020-07-02 LAB — CBC WITH DIFFERENTIAL/PLATELET
Abs Immature Granulocytes: 0.08 10*3/uL — ABNORMAL HIGH (ref 0.00–0.07)
Basophils Absolute: 0 10*3/uL (ref 0.0–0.1)
Basophils Relative: 0 %
Eosinophils Absolute: 0 10*3/uL (ref 0.0–0.5)
Eosinophils Relative: 0 %
HCT: 46.5 % — ABNORMAL HIGH (ref 36.0–46.0)
Hemoglobin: 14.8 g/dL (ref 12.0–15.0)
Immature Granulocytes: 1 %
Lymphocytes Relative: 9 %
Lymphs Abs: 0.9 10*3/uL (ref 0.7–4.0)
MCH: 27.1 pg (ref 26.0–34.0)
MCHC: 31.8 g/dL (ref 30.0–36.0)
MCV: 85 fL (ref 80.0–100.0)
Monocytes Absolute: 0.1 10*3/uL (ref 0.1–1.0)
Monocytes Relative: 1 %
Neutro Abs: 8.9 10*3/uL — ABNORMAL HIGH (ref 1.7–7.7)
Neutrophils Relative %: 89 %
Platelets: 324 10*3/uL (ref 150–400)
RBC: 5.47 MIL/uL — ABNORMAL HIGH (ref 3.87–5.11)
RDW: 14.3 % (ref 11.5–15.5)
WBC: 10 10*3/uL (ref 4.0–10.5)
nRBC: 0 % (ref 0.0–0.2)

## 2020-07-02 LAB — HIV ANTIBODY (ROUTINE TESTING W REFLEX): HIV Screen 4th Generation wRfx: NONREACTIVE

## 2020-07-02 LAB — COMPREHENSIVE METABOLIC PANEL
ALT: 28 U/L (ref 0–44)
AST: 20 U/L (ref 15–41)
Albumin: 4 g/dL (ref 3.5–5.0)
Alkaline Phosphatase: 69 U/L (ref 38–126)
Anion gap: 8 (ref 5–15)
BUN: 8 mg/dL (ref 6–20)
CO2: 22 mmol/L (ref 22–32)
Calcium: 9.9 mg/dL (ref 8.9–10.3)
Chloride: 105 mmol/L (ref 98–111)
Creatinine, Ser: 0.65 mg/dL (ref 0.44–1.00)
GFR, Estimated: >60 mL/min (ref >60)
Glucose, Bld: 166 mg/dL — ABNORMAL HIGH (ref 70–99)
Potassium: 4.1 mmol/L (ref 3.5–5.1)
Sodium: 135 mmol/L (ref 135–145)
Total Bilirubin: 0.4 mg/dL (ref 0.3–1.2)
Total Protein: 7.7 g/dL (ref 6.5–8.1)

## 2020-07-02 LAB — RPR: RPR Ser Ql: NONREACTIVE

## 2020-07-02 MED ORDER — LORATADINE 10 MG PO TABS: 10.0000 mg | ORAL_TABLET | Freq: Every day | ORAL | 0 refills | Status: DC

## 2020-07-02 NOTE — ED Triage Notes (Addendum)
Pt states rash/hives all over body - rash is painful on hands and feet.  Was given prednisone and benadryl at urgent care.  Rash persists.  She was given a cream for scabies, but hasn't used it b/c she does not feel she has scabies.

## 2020-07-02 NOTE — ED Provider Notes (Signed)
[facility] EMERGENCY DEPARTMENT Provider Note   CSN: [id number] Arrival date & time: 07/02/20  0757     History Chief Complaint  Patient presents with  . Rash    Michelle Yang is a 55 y.o. female with past medical history significant for transient ischemic attack and hypertension who presents to [facility] ED for evaluation of a rash.  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 She states that approximately five days ago, she noticed the development of an itchy rash to her right lower extremity. She took Benadryl, however the rash began to spread to involve her bilateral lower extremities, abdomen, back, upper chest and face over the past several days. She was evaluated in Urgent Care for this complaint two days prior and was prescribed a prednisone taper, permethrin cream and antihistamines. Patient reports that she has been taking prednisone and antihistamines, however she has not started using the permethrin cream. She denies any new medications within the past several months. She denies sore throat, fevers, chills, cough, congestion.   Past Medical History:  Diagnosis Date  . Medical history non-contributory   . Stroke [facility])    Patient Active Problem List   Diagnosis Date Noted  . TIA (transient ischemic attack) 11/17/2019  . Weakness of right side of body 11/17/2019  . Elevated blood pressure reading without diagnosis of hypertension 11/17/2019  . Abnormal serum creatinine level 11/17/2019  . Elevated glucose 11/17/2019  . 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 Cystocele with uterine prolapse 02/14/2018   Past Surgical History:  Procedure Laterality Date  . ANKLE FRACTURE SURGERY Left     OB History    Gravida  3   Para  3   Term  3   Preterm      AB      Living  3     SAB      IAB      Ectopic      Multiple      Live Births  3          Family History  Problem Relation Age of  Onset  . Diabetes Father    Social History   Tobacco Use  . Smoking status: Never Smoker  . Smokeless tobacco: Never Used  Substance Use Topics  . Alcohol use: No  . Drug use: No   Home Medications Prior to Admission medications   Medication Sig Start Date End Date Taking? Authorizing Provider  diphenhydrAMINE HCl (BENADRYL ALLERGY PO) Take 1 tablet by mouth every 5 (five) hours as needed (rash).   Yes [provider]  loratadine (CLARITIN) 10 MG tablet Take 1 tablet (10 mg total) by mouth daily. 07/02/20 08/01/20 Yes Roylene Reason, MD  predniSONE (DELTASONE) 10 MG tablet Begin with 6 tabs on day 1, 5 tab on day 2, 4 tab on day 3, 3 tab on day 4, 2 tab on day 5, 1 tab on day 6-take with food 06/30/20  Yes Wieters, Hallie C, PA-C  amLODipine (NORVASC) 5 MG tablet Take 1 tablet (5 mg total) by mouth daily. Patient not taking: Reported on 07/02/2020 03/16/20   Wieters, Fran Lowes C, PA-C  COVID-19 mRNA vaccine, Pfizer, 30 MCG/0.3ML injection INJECT AS DIRECTED 12/12/19 12/11/20  Judyann Munson, MD  COVID-19 mRNA vaccine, Pfizer, 30 MCG/0.3ML injection INJECT AS DIRECTED 11/21/19 11/20/20  Judyann Munson, MD  famotidine (PEPCID) 20 MG tablet Take 1 tablet (20 mg total) by mouth 2 (  two) times daily. Patient not taking: Reported on 07/02/2020 06/30/20   Wieters, Fran Lowes C, PA-C  influenza vac split quadrivalent PF (FLUARIX) 0.5 ML injection INJECT AS DIRECTED 12/19/19 12/18/20  Judyann Munson, MD  permethrin (ELIMITE) 5 % cream Apply head to toe, leave on for 8 to 10 hours and rinse off in morning Patient not taking: Reported on 07/02/2020 06/30/20   Wieters, Hallie C, PA-C  atorvastatin (LIPITOR) 80 MG tablet Take 1 tablet (80 mg total) by mouth daily. 11/19/19 03/16/20  Joseph Art, DO   Allergies    Patient has no known allergies.  Review of Systems   Review of Systems  Constitutional: Negative for chills and fever.  HENT: Negative for ear pain and sore throat.   Eyes: Negative for pain  and visual disturbance.  Respiratory: Negative for cough and shortness of breath.   Cardiovascular: Negative for chest pain and palpitations.  Gastrointestinal: Negative for abdominal pain and vomiting.  Genitourinary: Negative for dysuria and hematuria.  Musculoskeletal: Negative for arthralgias and back pain.  Skin: Positive for rash. Negative for color change.  Neurological: Negative for seizures and syncope.  All other systems reviewed and are negative.  Physical Exam Updated Vital Signs BP (!) 163/107 (BP Location: Right Arm)   Pulse (!) 111   Temp 97.6 F (36.4 C) (Oral)   Resp 16   Ht 5\' 6"  (1.676 m)   Wt 93.4 kg   LMP 01/13/2017 Comment: saturday  SpO2 100%   BMI 33.25 kg/m   Physical Exam Vitals and nursing note reviewed.  Constitutional:      General: She is not in acute distress.    Appearance: She is well-developed.  HENT:     Head: Normocephalic and atraumatic.  Eyes:     Conjunctiva/sclera: Conjunctivae normal.  Cardiovascular:     Rate and Rhythm: Regular rhythm. Tachycardia present.     Heart sounds: No murmur heard.   Pulmonary:     Effort: Pulmonary effort is normal. No respiratory distress.     Breath sounds: Normal breath sounds.  Abdominal:     Palpations: Abdomen is soft.     Tenderness: There is no abdominal tenderness.  Musculoskeletal:     Cervical back: Neck supple.  Skin:    Comments: Patient has scattered, blanchable, erythematous papules overlying the upper back, upper chest, bilateral lower back, and anterior right lower extremity. (See media tab for clinical image)  Neurological:     Mental Status: She is alert.    ED Results / Procedures / Treatments   Labs (all labs ordered are listed, but only abnormal results are displayed) Labs Reviewed  CBC WITH DIFFERENTIAL/PLATELET - Abnormal; Notable for the following components:      Result Value   RBC 5.47 (*)    HCT 46.5 (*)    Neutro Abs 8.9 (*)    Abs Immature Granulocytes 0.08  (*)    All other components within normal limits  COMPREHENSIVE METABOLIC PANEL - Abnormal; Notable for the following components:   Glucose, Bld 166 (*)    All other components within normal limits  RPR  HIV ANTIBODY (ROUTINE TESTING W REFLEX)   EKG EKG Interpretation  Date/Time:  Friday Jul 02 2020 10:35:09 EDT Ventricular Rate:  92 PR Interval:  152 QRS Duration: 72 QT Interval:  362 QTC Calculation: 447 R Axis:   -1 Text Interpretation: Normal sinus rhythm Nonspecific T wave abnormality Abnormal ECG Confirmed by 10[birth year]28[birth year]1990 [phone]) on 07/02/2020 10:47:01 AM  Radiology No results found.  Medications Ordered in ED Medications - No data to display  ED Course  I have reviewed the triage vital signs and the nursing notes.  Pertinent labs & imaging results that were available during my care of the patient were reviewed by me and considered in my medical decision making (see chart for details).    MDM Rules/Calculators/A&P                          Patient presenting with five day history of pruritic generalized, nonspecific papular rash with no obvious inciting trigger. Patient is otherwise asymptomatic, although she is tachycardic which raises concern of a possible systemic process. Will obtain EKG and labs (CBC, CMP, HIV and RPR) for further evaluation. Patient's rash does not appear morbilliform which would be consistent with a drug eruption or viral exanthem. Scabies and bed bugs infestation continues to remain a consideration given the nature of her work although her rash does not appear typical for either.  Review of patient's medical record reveals many ED and urgent care visits for similar rashes over the past several years. This raises suspicion for a more chronic condition with a waxing/waning course rather than an acute eruption.   Patient's CBC with WBC of 10.0 and Neutro Abs of 8.9 and CMP with glucose of 166 both of which can be explained by patient's systemic  corticosteroid use. Patient's HIV and RPR will take a few days to result.  Patient is medically clear for discharge. Advised patient to take Claritin every morning, Benadryl as need in evening and continue prednisone taper until medication is complete. Advised patient to establish care with primary care physician and seek dermatology referral. She understands and agrees with this plan.  Final Clinical Impression(s) / ED Diagnoses Final diagnoses:  Rash and nonspecific skin eruption   Rx / DC Orders ED Discharge Orders         Ordered    loratadine (CLARITIN) 10 MG tablet  Daily        07/02/20 1228           Roylene Reason, MD 07/02/20 1228    Tilden Fossa, MD 07/04/20 1226

## 2020-07-02 NOTE — Discharge Instructions (Addendum)
Michelle Yang,  It was a pleasure having the opportunity to meet you in the emergency department. For your rash, I advise you to stop all scented detergents and switch to free and clear detergents. Please continue the prednisone taper as prescribed at urgent care. Additionally, you may continue taking Benadryl in the evening as needed and can start taking Claritin every morning. We strongly encourage for you to establish with a primary care physician so that you can obtain a dermatology referral.  Sincerely, Dr. Jasmine December, MD

## 2020-07-03 ENCOUNTER — Ambulatory Visit
Admission: EM | Admit: 2020-07-03 | Discharge: 2020-07-03 | Disposition: A | Discharge status: Home / Self Care | Payer: Self-pay | Attending: Family Medicine | Admitting: Family Medicine

## 2020-07-03 ENCOUNTER — Other Ambulatory Visit: Payer: Self-pay

## 2020-07-03 DIAGNOSIS — R21 Rash and other nonspecific skin eruption: Secondary | ICD-10-CM

## 2020-07-03 DIAGNOSIS — R7309 Other abnormal glucose: Secondary | ICD-10-CM

## 2020-07-03 DIAGNOSIS — M792 Neuralgia and neuritis, unspecified: Secondary | ICD-10-CM

## 2020-07-03 MED ORDER — FAMOTIDINE 20 MG PO TABS: 20.0000 mg | ORAL_TABLET | Freq: Two times a day (BID) | ORAL | 0 refills | Status: DC

## 2020-07-03 MED ORDER — TRIAMCINOLONE ACETONIDE 0.025 % EX CREA: 1.0000 "application " | TOPICAL_CREAM | Freq: Two times a day (BID) | CUTANEOUS | 0 refills | Status: AC | PRN

## 2020-07-03 NOTE — ED Triage Notes (Signed)
Patient presents to Urgent Care with complaints of shingles rash. She was seen Thurs for generalized rash that was noted Monday. Pt was prescribed a steroid  with no relief. She states rash has gotten worse so seen at [facility] ED. Per pt they could not distinguish type of rash and no new rx instructed to continue steroid tx. The pharmacist at [facility] pharmacy told pt rash is most likely shingles and instructed her to come back to UC for eval.   Denies fever.

## 2020-07-03 NOTE — Discharge Instructions (Signed)
I am rechecking an A1c as it was checked last year and you were borderline at being diabetic and given the distribution of your rash and the character of the pain I am concerned that this is of a neuropathic etiology which can be associated with diabetes.  Once I receive your A1c you will be notified and advised of any changes to your current steroid regimen because if you are diabetic I need to taper you off of the steroid oral taper a little bit sooner and at a lower dose.  I would like you to start taking the famotidine 20 mg twice daily until rash completely resolves as well as continuing the Claritin.  I have added triamcinolone cream for you to use on your hands and your feet this to definitely give you relief with the raised rashes on your hands and your feet. I provided you the information to get established with [facility] dermatology and you must get established with a primary care doctor and I have placed a referral for you to be seen at [facility] and provided you with their contact information.

## 2020-07-03 NOTE — ED Provider Notes (Signed)
[name]    CSN: [id number] Arrival date & time: 07/03/20  1008      History   Chief Complaint Chief Complaint  Patient presents with  . Rash  . Urticaria    HPI Michelle Yang is a 55 y.o. female.   HPI  Patient with a medical history significant of TIA, hypertension, hyperlipidemia presents today for an unrelieved rash that has been ongoing now for nearly 1 week.  Patient was seen here in clinic 5/18 and at that time had a nonbullous rash however it started diffusely spread all over the body and she was placed on a prednisone taper.  The rash has improved in appearance however she describes a recurrent red hive-like distribution all over her body which is neuropathic in distribution and character of the pain.  She reports a shooting pain with emergent eruption of red patch like rashes appearing on diffuse areas of her body including the palms of her hands and the bottom of her feet.  She is also noticed a rash on the lateral aspects of bilateral legs hands bilateral arms and torso. In review of EMR patient had a elevated A1c when she was hospitalized last fall she was borderline diabetic with an A1c of 6.3.  This level has not been rechecked since that time.  She was concerned as she thought she may be experiencing shingles and she had some eruptions of a rash involving both sides of her face yesterday.  She has had no fever.  She presented to the ER yesterday morning due to the concern of the rash developing to her face reports she was fasting at that time and her glucose level was 166 per recent labs collected yesterday.  Her A1c has not been rechecked since her hospitalization.  She denies any fever, headache, nausea, vomiting dizziness,  soaps or pets.  Past Medical History:  Diagnosis Date  . Medical history non-contributory   . Stroke [facility])     Patient Active Problem List   Diagnosis Date Noted  . TIA (transient ischemic attack) 11/17/2019  . Weakness of  right side of body 11/17/2019  . Elevated blood pressure reading without diagnosis of hypertension 11/17/2019  . Abnormal serum creatinine level 11/17/2019  . Elevated glucose 11/17/2019  . 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 ANKLE FRACTURE SURGERY Left     OB History    Gravida  3   Para  3   Term  3   Preterm      AB      Living  3     SAB      IAB      Ectopic      Multiple      Live Births  3            Home Medications    Prior to Admission medications   Medication Sig Start Date End Date Taking? Authorizing Provider  famotidine (PEPCID) 20 MG tablet Take 1 tablet (20 mg total) by mouth 2 (two) times daily. 07/03/20  Yes Bing Neighbors, FNP  triamcinolone (KENALOG) 0.025 % cream Apply 1 application topically 2 (two) times daily as needed (Apply to affected areas twice daily as needed). 07/03/20  Yes Bing Neighbors, FNP  amLODipine (NORVASC) 5 MG tablet Take 1 tablet (5 mg total) by mouth daily. Patient not taking: Reported on 07/02/2020 03/16/20   Sharyon Cable,  Hallie C, PA-C  COVID-19 mRNA vaccine, Pfizer, 30 MCG/0.3ML injection INJECT AS DIRECTED 12/12/19 12/11/20  Judyann Munson, MD  COVID-19 mRNA vaccine, Pfizer, 30 MCG/0.3ML injection INJECT AS DIRECTED 11/21/19 11/20/20  Judyann Munson, MD  diphenhydrAMINE HCl (BENADRYL ALLERGY PO) Take 1 tablet by mouth every 5 (five) hours as needed (rash).    [provider]  influenza vac split quadrivalent PF (FLUARIX) 0.5 ML injection INJECT AS DIRECTED 12/19/19 12/18/20  Judyann Munson, MD  loratadine (CLARITIN) 10 MG tablet Take 1 tablet (10 mg total) by mouth daily. 07/02/20 08/01/20  Roylene Reason, MD  permethrin (ELIMITE) 5 % cream Apply head to toe, leave on for 8 to 10 hours and rinse off in morning Patient not taking: Reported on 07/02/2020 06/30/20   Wieters, Hallie C, PA-C  predniSONE (DELTASONE) 10  MG tablet Begin with 6 tabs on day 1, 5 tab on day 2, 4 tab on day 3, 3 tab on day 4, 2 tab on day 5, 1 tab on day 6-take with food 06/30/20   Wieters, Hallie C, PA-C  atorvastatin (LIPITOR) 80 MG tablet Take 1 tablet (80 mg total) by mouth daily. 11/19/19 03/16/20  Joseph Art, DO    Family History Family History  Problem Relation Age of Onset  . Diabetes Father     Social History Social History   Tobacco Use  . Smoking status: Never Smoker  . Smokeless tobacco: Never Used  Substance Use Topics  . Alcohol use: No  . Drug use: No     Allergies   Patient has no known allergies.  Review of Systems Review of Systems Pertinent negatives listed in HPI  Physical Exam Triage Vital Signs ED Triage Vitals  Enc Vitals Group     BP 07/03/20 1211 (!) 152/91     Pulse Rate 07/03/20 1211 (!) 118     Resp 07/03/20 1211 16     Temp 07/03/20 1211 98.4 F (36.9 C)     Temp Source 07/03/20 1211 Oral     SpO2 07/03/20 1211 95 %     Weight --      Height --      Head Circumference --      Peak Flow --      Pain Score 07/03/20 1210 8     Pain Loc --      Pain Edu? --      Excl. in GC? --    No data found.  Updated Vital Signs BP (!) 152/91 (BP Location: Left Arm)   Pulse (!) 118   Temp 98.4 F (36.9 C) (Oral)   Resp 16   LMP 01/13/2017 Comment: saturday  SpO2 95%   Visual Acuity Right Eye Distance:   Left Eye Distance:   Bilateral Distance:    Right Eye Near:   Left Eye Near:    Bilateral Near:     Physical Exam Constitutional:      Appearance: She is obese. She is not ill-appearing.  Cardiovascular:     Rate and Rhythm: Regular rhythm. Tachycardia present.  Pulmonary:     Effort: Pulmonary effort is normal.     Breath sounds: Normal breath sounds.  Musculoskeletal:     Cervical back: Normal range of motion.  Skin:    General: Skin is warm.     Capillary Refill: Capillary refill takes less than 2 seconds.     Findings: Erythema and rash present. Rash is  macular and papular.     Comments: Rashes  generalized only distributed on arms legs punctures of the hands, surfaces of feet torso, ears and face  Neurological:     Mental Status: She is alert.       UC Treatments / Results  Labs (all labs ordered are listed, but only abnormal results are displayed) Labs Reviewed  HEMOGLOBIN A1C    EKG   Radiology No results found.  Procedures Procedures (including critical care time)  Medications Ordered in UC Medications - No data to display  Initial Impression / Assessment and Plan / UC Course  I have reviewed the triage vital signs and the nursing notes.  Pertinent labs & imaging results that were available during my care of the patient were reviewed by me and considered in my medical decision making (see chart for details).     Diffuse rash of unknown etiology with a neuropathic pain distribution associated with the rash.  Given history of elevated hemoglobin A1c I am repeating the A1c today as patient has been on a moderately dosed taper for the last few days and I am concerned that the reason she is experiencing increased pain with reduction of the rashes that we may be superimposed on hyperglycemia therefore I have an A1c pending.  If A1c reveals diabetes will reduce taper in half and slowly remove patient off of the prednisone.  I am adding famotidine 20 mg twice daily indefinitely until rash completely resolves and she will continue the Claritin as well.  The rash itself appears to be improving as there is only traces of small red macules like isolated lesions distributed throughout the body however on the palmar surfaces of hands and palmar surfaces of both feet erythematous macular papular rash is distributed.  Advised patient to trial triamcinolone cream to relieve itching and burning of hands and feet.  Patient was provided information to get established at [facility] as she needs a primary care given her medical  history and no one is actively managing her for primary care.  Patient also given information to get established with [facility] dermatology for further management of rash. Final Clinical Impressions(s) / UC Diagnoses   Final diagnoses:  Rash of body  Neuropathic pain asscoiated rash  Elevated hemoglobin A1c     Discharge Instructions     I am rechecking an A1c as it was checked last year and you were borderline at being diabetic and given the distribution of your rash and the character of the pain I am concerned that this is of a neuropathic etiology which can be associated with diabetes.  Once I receive your A1c you will be notified and advised of any changes to your current steroid regimen because if you are diabetic I need to taper you off of the steroid oral taper a little bit sooner and at a lower dose.  I would like you to start taking the famotidine 20 mg twice daily until rash completely resolves as well as continuing the Claritin.  I have added triamcinolone cream for you to use on your hands and your feet this to definitely give you relief with the raised rashes on your hands and your feet. I provided you the information to get established with [facility] dermatology and you must get established with a primary care doctor and I have placed a referral for you to be seen at [facility] and provided you with their contact information.   ED Prescriptions    Medication Sig Dispense Auth. Provider   famotidine (PEPCID) 20  MG tablet Take 1 tablet (20 mg total) by mouth 2 (two) times daily. 60 tablet Bing Neighbors, FNP   triamcinolone (KENALOG) 0.025 % cream Apply 1 application topically 2 (two) times daily as needed (Apply to affected areas twice daily as needed). 454 g Bing Neighbors, FNP     PDMP not reviewed this encounter.   Bing Neighbors, FNP 07/03/20 1339

## 2020-07-05 LAB — HEMOGLOBIN A1C
Est. average glucose Bld gHb Est-mCnc: 137 mg/dL
Hgb A1c MFr Bld: 6.4 % — ABNORMAL HIGH (ref 4.8–5.6)

## 2020-09-06 ENCOUNTER — Encounter (HOSPITAL_COMMUNITY): Payer: Self-pay | Admitting: *Deleted

## 2020-09-06 ENCOUNTER — Encounter (HOSPITAL_COMMUNITY): Payer: Self-pay

## 2020-09-06 ENCOUNTER — Ambulatory Visit (HOSPITAL_COMMUNITY)
Admission: EM | Admit: 2020-09-06 | Discharge: 2020-09-06 | Disposition: A | Discharge status: Home / Self Care | Payer: BC Managed Care – PPO | Attending: Physician Assistant | Admitting: Physician Assistant

## 2020-09-06 ENCOUNTER — Ambulatory Visit (HOSPITAL_COMMUNITY)
Admission: EM | Admit: 2020-09-06 | Discharge: 2020-09-06 | Disposition: A | Discharge status: Home / Self Care | Payer: BC Managed Care – PPO

## 2020-09-06 ENCOUNTER — Other Ambulatory Visit: Payer: Self-pay

## 2020-09-06 ENCOUNTER — Ambulatory Visit (INDEPENDENT_AMBULATORY_CARE_PROVIDER_SITE_OTHER): Payer: BC Managed Care – PPO

## 2020-09-06 ENCOUNTER — Emergency Department (HOSPITAL_COMMUNITY): Admission: EM | Admit: 2020-09-06 | Discharge: 2020-09-06 | Discharge status: Against Medical Advice | Payer: Self-pay

## 2020-09-06 DIAGNOSIS — S61213A Laceration without foreign body of left middle finger without damage to nail, initial encounter: Secondary | ICD-10-CM

## 2020-09-06 DIAGNOSIS — Z23 Encounter for immunization: Secondary | ICD-10-CM | POA: Diagnosis not present

## 2020-09-06 DIAGNOSIS — S61213D Laceration without foreign body of left middle finger without damage to nail, subsequent encounter: Secondary | ICD-10-CM

## 2020-09-06 IMAGING — DX DG FINGER MIDDLE 2+V*L*
3 series · 3 of 3 positions shown · non-contrast
Comparison: None.

CLINICAL DATA: Finger laceration.

EXAM:
LEFT MIDDLE FINGER 2+V

[finger ap]
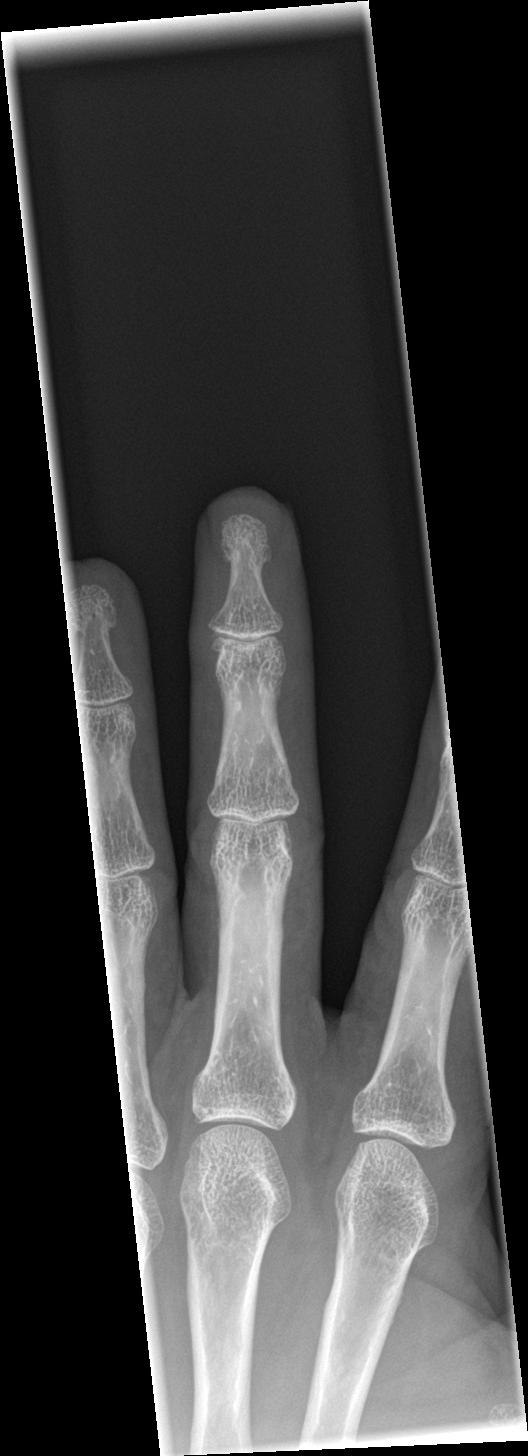

[finger obl]
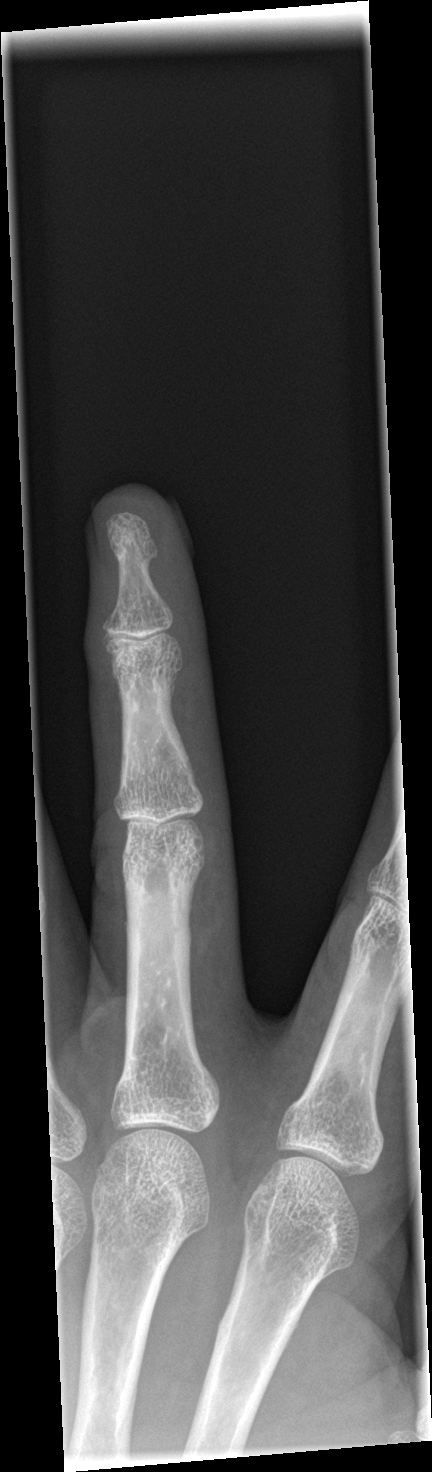

[finger lat]
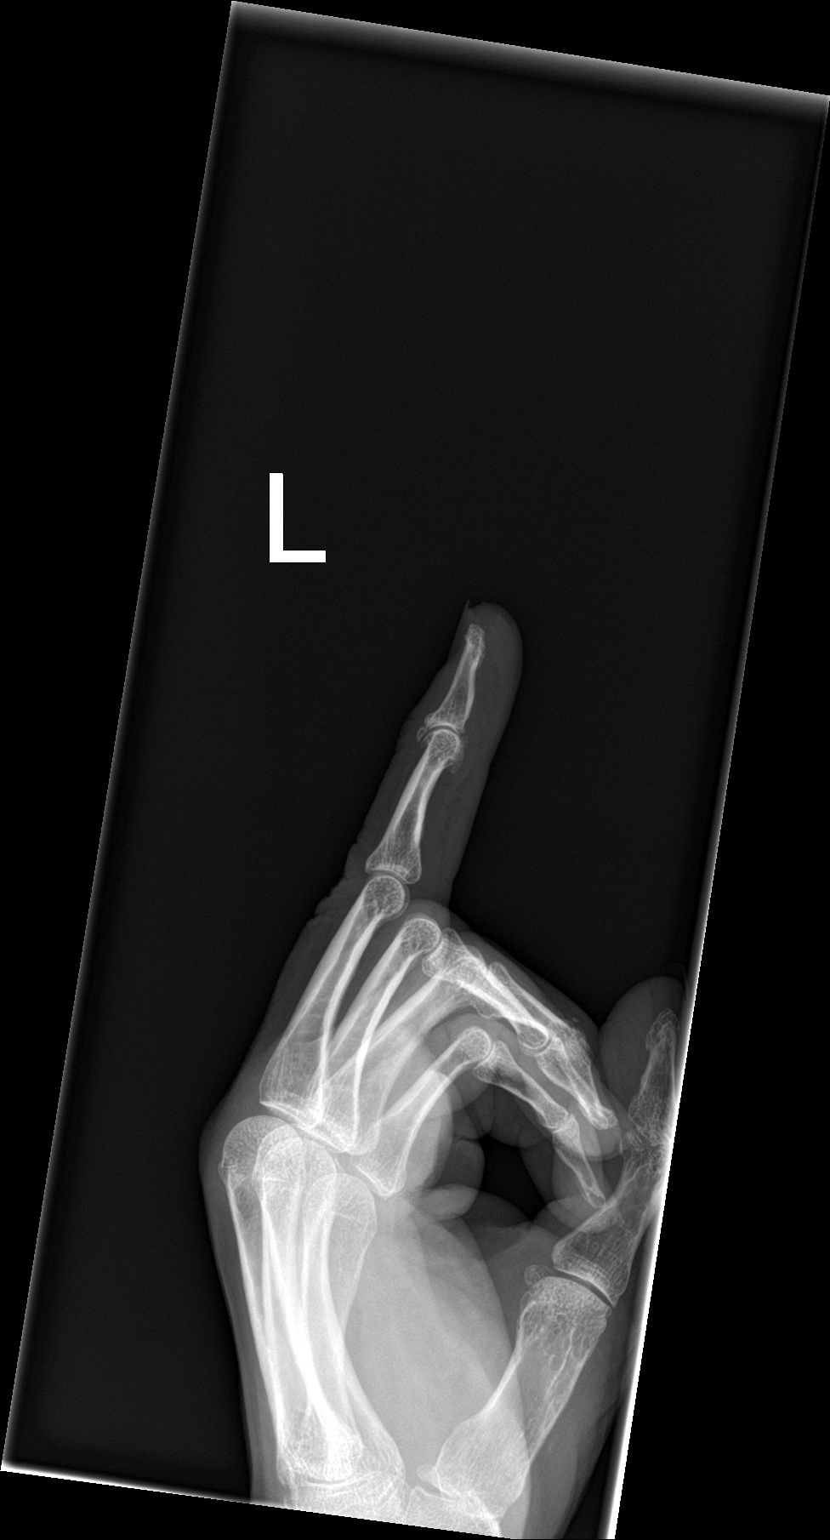

[3 of 3 positions shown; findings below may reference images not displayed]

FINDINGS: There is no evidence of fracture or dislocation. There is no
evidence of arthropathy or other focal bone abnormality. Soft
tissues are unremarkable. No radiopaque foreign body is noted.
IMPRESSION: Negative.

## 2020-09-06 MED ORDER — TETANUS-DIPHTH-ACELL PERTUSSIS 5-2.5-18.5 LF-MCG/0.5 IM SUSY
PREFILLED_SYRINGE | INTRAMUSCULAR | Status: AC
  Filled 2020-09-06: qty 0.5

## 2020-09-06 MED ORDER — LIDOCAINE HCL 2 % IJ SOLN
INTRAMUSCULAR | Status: AC
  Filled 2020-09-06: qty 20

## 2020-09-06 MED ORDER — TETANUS-DIPHTH-ACELL PERTUSSIS 5-2.5-18.5 LF-MCG/0.5 IM SUSY
0.5000 mL | PREFILLED_SYRINGE | Freq: Once | INTRAMUSCULAR | Status: AC
  Administered 2020-09-06: 0.5 mL via INTRAMUSCULAR

## 2020-09-06 NOTE — Discharge Instructions (Addendum)
Keep area clean and dry for 12 to 24 hours.  Use finger splint daily to protect the area and prevent wound from opening.  Use Tylenol and ibuprofen for pain relief.  If you have any signs of infection including swelling, pain, fever, nausea, vomiting you need to be reevaluated immediately.  Your tetanus was updated today.

## 2020-09-06 NOTE — ED Triage Notes (Signed)
Pt in with left middle finger lac that occurred today while she was eating lunch.  States she had her knife for her food and forgot that she had it in a pocket nearby and when she reached back she cute her finger  Denies any pain at this time

## 2020-09-06 NOTE — Discharge Instructions (Addendum)
Keep area clean and covered.  If you have any worsening symptoms please return for reevaluation as we discussed.

## 2020-09-06 NOTE — ED Triage Notes (Signed)
Pt returns today after earlier visit today . Pt reports finger was bleeding. No blood seen on DSy .

## 2020-09-06 NOTE — ED Provider Notes (Signed)
[facility]    CSN: [id number] Arrival date & time: 09/06/20  1439      History   Chief Complaint Chief Complaint  Patient presents with   Laceration    HPI Michelle Yang is a 55 y.o. female.   Patient presents today with a several hour history of laceration to her left middle finger.  Reports that she was eating her lunch and forgot that she had a knife in the side of her car and so put her finger down which led to a laceration.  She did not clean the area but did apply pressure with adequate hemostasis.  She reports pain is rated 1 on a 0-10 pain scale, localized to laceration, described as aching, no aggravating relieving factors identified.  She is right-handed.  Denies any numbness, weakness, difficulty moving finger.  She is unsure when her last tetanus vaccine was but is open to updating this today.   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 Authorizing Provider  amLODipine (NORVASC) 5 MG tablet Take 1 tablet (5 mg total) by mouth daily. Patient not taking: Reported on 07/02/2020 03/16/20   Wieters, Fran Lowes C, PA-C  COVID-19 mRNA vaccine, Pfizer, 30 MCG/0.3ML  injection INJECT AS DIRECTED 12/12/19 12/11/20  Judyann Munson, MD  COVID-19 mRNA vaccine, Pfizer, 30 MCG/0.3ML injection INJECT AS DIRECTED 11/21/19 11/20/20  Judyann Munson, MD  diphenhydrAMINE HCl (BENADRYL ALLERGY PO) Take 1 tablet by mouth every 5 (five) hours as needed (rash).    [provider]  famotidine (PEPCID) 20 MG tablet Take 1 tablet (20 mg total) by mouth 2 (two) times daily. 07/03/20   Bing Neighbors, FNP  influenza vac split quadrivalent PF (FLUARIX) 0.5 ML injection INJECT AS DIRECTED 12/19/19 12/18/20  Judyann Munson, MD  loratadine (CLARITIN) 10 MG tablet Take 1 tablet (10 mg total) by mouth daily. 07/02/20 08/01/20  Roylene Reason, MD  permethrin (ELIMITE) 5 % cream Apply head to toe, leave on for 8 to 10 hours and rinse off in morning Patient not taking: Reported on 07/02/2020 06/30/20   Wieters, Hallie C, PA-C  predniSONE (DELTASONE) 10 MG tablet Begin with 6 tabs on day 1, 5 tab on day 2, 4 tab on day 3, 3 tab on day 4, 2 tab on day 5, 1 tab on day 6-take with food 06/30/20   Wieters, Fortuna C, PA-C  triamcinolone (KENALOG)  0.025 % cream Apply 1 application topically 2 (two) times daily as needed (Apply to affected areas twice daily as needed). 07/03/20   Bing Neighbors, FNP  atorvastatin (LIPITOR) 80 MG tablet Take 1 tablet (80 mg total) by mouth daily. 11/19/19 03/16/20  Joseph Art, DO    Family History Family History  Problem Relation Age of Onset   Diabetes Father     Social History Social History   Tobacco Use   Smoking status: Never   Smokeless tobacco: Never  Substance Use Topics   Alcohol use: No   Drug use: No     Allergies   Patient has no known allergies.   Review of Systems Review of Systems  Constitutional:  Positive for activity change. Negative for appetite change, fatigue and fever.  Respiratory:  Negative for cough and shortness of breath.   Cardiovascular:  Negative for chest pain.  Gastrointestinal:  Negative for  abdominal pain, diarrhea, nausea and vomiting.  Skin:  Positive for wound. Negative for color change.  Neurological:  Negative for dizziness, weakness, light-headedness, numbness and headaches.    Physical Exam Triage Vital Signs ED Triage Vitals  Enc Vitals Group     BP 09/06/20 1451 (!) 150/115     Pulse Rate 09/06/20 1451 (!) 118     Resp 09/06/20 1451 18     Temp 09/06/20 1451 98.3 F (36.8 C)     Temp Source 09/06/20 1451 Oral     SpO2 09/06/20 1451 96 %     Weight --      Height --      Head Circumference --      Peak Flow --      Pain Score 09/06/20 1450 0     Pain Loc --      Pain Edu? --      Excl. in GC? --    No data found.  Updated Vital Signs BP (!) 150/115 (BP Location: Right Arm)   Pulse (!) 118   Temp 98.3 F (36.8 C) (Oral)   Resp 18   LMP 01/13/2017 Comment: saturday  SpO2 96%   Visual Acuity Right Eye Distance:   Left Eye Distance:   Bilateral Distance:    Right Eye Near:   Left Eye Near:    Bilateral Near:     Physical Exam Vitals reviewed.  Constitutional:      General: She is awake. She is not in acute distress.    Appearance: Normal appearance. She is normal weight. She is not ill-appearing.     Comments: Very pleasant female appears stated age in no acute distress sitting comfortably in exam room  HENT:     Head: Normocephalic and atraumatic.  Cardiovascular:     Rate and Rhythm: Normal rate and regular rhythm.     Pulses:          Radial pulses are 2+ on the right side and 2+ on the left side.     Heart sounds: Normal heart sounds, S1 normal and S2 normal. No murmur heard.    Comments: Capillary refill within 2 seconds bilateral hands Pulmonary:     Effort: Pulmonary effort is normal.     Breath sounds: Normal breath sounds. No wheezing, rhonchi or rales.  Abdominal:     General: Bowel sounds are normal.     Palpations: Abdomen is soft.     Tenderness: There is no abdominal tenderness. There is no right CVA tenderness, left CVA  tenderness, guarding  or rebound.  Musculoskeletal:     Comments: Hands: Neurovascularly intact.  Normal sensation.  Strong two-point discrimination.  Normal active range of motion.  Skin:    Findings: Laceration present.     Comments: 3 cm laceration noted distal left middle finger.  No active bleeding or drainage noted.  Psychiatric:        Behavior: Behavior is cooperative.       UC Treatments / Results  Labs (all labs ordered are listed, but only abnormal results are displayed) Labs Reviewed - No data to display  EKG   Radiology DG Finger Middle Left  Result Date: 09/06/2020 CLINICAL DATA:  Finger laceration. EXAM: LEFT MIDDLE FINGER 2+V COMPARISON:  None. FINDINGS: There is no evidence of fracture or dislocation. There is no evidence of arthropathy or other focal bone abnormality. Soft tissues are unremarkable. No radiopaque foreign body is noted. IMPRESSION: Negative. Electronically Signed   By: Lupita RaiderJames  Green Jr M.D.   On: 09/06/2020 15:47    Procedures Laceration Repair  Date/Time: 09/06/2020 4:06 PM Performed by: Jeani Hawkingaspet, Alexus Galka K, PA-C Authorized by: Jeani Hawkingaspet, Everette Mall K, PA-C   Consent:    Consent obtained:  Verbal   Consent given by:  Patient   Risks, benefits, and alternatives were discussed: yes     Risks discussed:  Infection, pain, retained foreign body, poor cosmetic result and need for additional repair   Alternatives discussed:  No treatment, delayed treatment, observation and referral Universal protocol:    Procedure explained and questions answered to patient or proxy's satisfaction: yes     Test results available: yes     Imaging studies available: yes     Patient identity confirmed:  Verbally with patient Anesthesia:    Anesthesia method:  Nerve block   Block location:  Left middle finger   Block needle gauge:  27 G   Block anesthetic:  Lidocaine 2% w/o epi   Block technique:  Digital block   Block injection procedure:  Negative aspiration for blood and  anatomic landmarks identified   Block outcome:  Anesthesia achieved Laceration details:    Location:  Finger   Finger location:  L long finger   Length (cm):  3   Depth (mm):  1 Pre-procedure details:    Preparation:  Patient was prepped and draped in usual sterile fashion Exploration:    Imaging obtained: x-ray     Imaging outcome: foreign body not noted   Treatment:    Area cleansed with:  Chlorhexidine   Amount of cleaning:  Standard   Irrigation solution:  Sterile water   Irrigation volume:  10 mL   Irrigation method:  Syringe   Debridement:  None Skin repair:    Repair method:  Tissue adhesive Approximation:    Approximation:  Close Repair type:    Repair type:  Simple Post-procedure details:    Dressing:  Splint for protection   Procedure completion:  Tolerated well, no immediate complications (including critical care time)  Medications Ordered in UC Medications  Tdap (BOOSTRIX) injection 0.5 mL (0.5 mLs Intramuscular Given 09/06/20 1547)    Initial Impression / Assessment and Plan / UC Course  I have reviewed the triage vital signs and the nursing notes.  Pertinent labs & imaging results that were available during my care of the patient were reviewed by me and considered in my medical decision making (see chart for details).      X-ray obtained to rule out retained foreign body showed no acute findings or  evidence of retained foreign body.  Digital block was performed to allow for adequate cleaning.  Area was cleaned and irrigated to ensure no retained foreign body.  Laceration closed with tissue adhesive and patient placed in splint for protection.  No evidence of contaminated wound or need for prophylactic antibiotics.  Tetanus was updated today.  Patient to use over-the-counter medications for pain relief.  Discussed alarm symptoms that warrant emergent evaluation.  Final Clinical Impressions(s) / UC Diagnoses   Final diagnoses:  Laceration of left middle  finger without foreign body without damage to nail, initial encounter     Discharge Instructions      Keep area clean and dry for 12 to 24 hours.  Use finger splint daily to protect the area and prevent wound from opening.  Use Tylenol and ibuprofen for pain relief.  If you have any signs of infection including swelling, pain, fever, nausea, vomiting you need to be reevaluated immediately.  Your tetanus was updated today.     ED Prescriptions   None    PDMP not reviewed this encounter.   Jeani Hawking, PA-C 09/06/20 1611

## 2020-09-06 NOTE — ED Provider Notes (Signed)
[facility]    CSN: [id number] Arrival date & time: 09/06/20  1844      History   Chief Complaint Chief Complaint  Patient presents with   Laceration    HPI Michelle Yang is a 55 y.o. female.   Patient return to clinic after having been seen earlier with finger laceration due to bleeding.  She was placed in a splint upon discharge and believes that she bumped this causing this to bleed.  She reports bleeding has stopped with pressure.  She is having some throbbing pain.  She denies any numbness or paresthesias.   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 Authorizing Provider  amLODipine (NORVASC) 5 MG tablet Take 1 tablet (5 mg total) by mouth daily. Patient not taking: Reported on 07/02/2020 03/16/20   Wieters, Fran Lowes C, PA-C  COVID-19 mRNA vaccine, Pfizer, 30 MCG/0.3ML injection INJECT AS DIRECTED 12/12/19 12/11/20  Judyann Munson, MD  COVID-19 mRNA vaccine, Pfizer, 30 MCG/0.3ML injection INJECT AS DIRECTED 11/21/19 11/20/20  Judyann Munson, MD  diphenhydrAMINE HCl (BENADRYL ALLERGY PO) Take 1 tablet by mouth every 5 (five) hours as needed (rash).    [provider]  famotidine (PEPCID) 20 MG tablet Take 1 tablet (20 mg total) by mouth 2 (two) times daily. 07/03/20   Bing Neighbors, FNP  influenza vac split quadrivalent PF (FLUARIX) 0.5 ML injection INJECT AS DIRECTED 12/19/19 12/18/20  Judyann Munson, MD  loratadine (CLARITIN) 10 MG tablet Take 1 tablet (10 mg total) by mouth daily. 07/02/20 08/01/20  Roylene Reason, MD  permethrin (ELIMITE) 5 % cream Apply head to toe, leave on for 8 to 10 hours and rinse off in morning Patient not taking: Reported on 07/02/2020 06/30/20   Wieters, Hallie C, PA-C  predniSONE (DELTASONE) 10 MG tablet Begin with 6 tabs on day 1, 5 tab on day 2, 4 tab on day 3, 3 tab on day 4, 2 tab on day 5, 1 tab on day 6-take with food 06/30/20   Wieters, Hallie C, PA-C  triamcinolone (KENALOG) 0.025 % cream Apply 1 application topically 2 (two) times daily as needed (Apply to affected areas twice daily as needed). 07/03/20   Bing Neighbors, FNP  atorvastatin (LIPITOR) 80 MG tablet Take 1 tablet (80 mg total) by mouth daily. 11/19/19 03/16/20  Joseph Art, DO    Family History Family History  Problem Relation Age of Onset   Diabetes Father     Social History Social History   Tobacco Use   Smoking status: Never   Smokeless tobacco: Never  Substance Use Topics   Alcohol use: No   Drug use: No     Allergies   Patient has no known allergies.   Review of Systems Review of Systems  Per HPI Physical Exam Triage Vital Signs ED Triage Vitals  Enc Vitals Group     BP 09/06/20 2017 138/86     Pulse Rate 09/06/20 2017 (!) 104     Resp 09/06/20 2017 20     Temp 09/06/20 2017 98.6 F (37 C)     Temp src --      SpO2 09/06/20 2017 96 %     Weight --      Height --      Head Circumference --      Peak Flow --      Pain Score 09/06/20 2019 0     Pain Loc --      Pain Edu? --      Excl. in GC? --    No data found.  Updated Vital Signs BP 138/86   Pulse (!) 104   Temp 98.6 F (37 C)   Resp 20    LMP 01/13/2017 Comment: saturday  SpO2 96%   Visual Acuity Right Eye Distance:   Left Eye Distance:   Bilateral Distance:    Right Eye Near:   Left Eye Near:    Bilateral Near:     Physical Exam Skin:    Comments: Laceration is well approximated with no obvious active bleeding.  Pooled blood noted in splint.       UC Treatments / Results  Labs (all labs ordered are listed, but only abnormal results are displayed) Labs Reviewed - No data to display  EKG   Radiology DG Finger Middle Left  Result Date: 09/06/2020 CLINICAL DATA:  Finger laceration. EXAM: LEFT MIDDLE FINGER 2+V COMPARISON:  None. FINDINGS: There is no evidence of fracture or dislocation. There is no evidence of arthropathy or other focal bone abnormality. Soft tissues are unremarkable. No radiopaque foreign body is noted. IMPRESSION: Negative. Electronically Signed   By: Lupita Raider M.D.   On: 09/06/2020 15:47    Procedures Procedures (including critical care time)  Medications Ordered in UC Medications - No data to display  Initial Impression / Assessment and Plan / UC Course  I have reviewed the triage vital signs and the nursing notes.  Pertinent labs & imaging results that were available during my care of the patient were reviewed by me and considered in my medical decision making (see chart for details).    No active bleeding.  Area was cleaned and redressed.  Discussed that patient should avoid bumping area is much as possible.  She was not placed in a splint again as she believes that this contributed to hitting finger the first time.  Discussed that if she has recurrent symptoms she should return for reevaluation.  Patient was not charged for this visit.  Final Clinical Impressions(s) / UC Diagnoses   Final diagnoses:  Laceration of left middle finger without foreign body without damage to nail, subsequent encounter     Discharge Instructions      Keep area clean and covered.  If you  have any worsening symptoms please return for reevaluation as we discussed.     ED Prescriptions   None  PDMP not reviewed this encounter.   Jeani Hawking, Cordelia Poche 09/06/20 2117

## 2021-01-12 ENCOUNTER — Other Ambulatory Visit: Payer: Self-pay

## 2021-01-12 ENCOUNTER — Encounter (HOSPITAL_COMMUNITY): Payer: Self-pay | Admitting: *Deleted

## 2021-01-12 ENCOUNTER — Ambulatory Visit (HOSPITAL_COMMUNITY)
Admission: EM | Admit: 2021-01-12 | Discharge: 2021-01-12 | Disposition: A | Discharge status: Home / Self Care | Payer: BC Managed Care – PPO | Attending: Emergency Medicine | Admitting: Emergency Medicine

## 2021-01-12 DIAGNOSIS — J020 Streptococcal pharyngitis: Secondary | ICD-10-CM | POA: Insufficient documentation

## 2021-01-12 LAB — RESPIRATORY PANEL BY PCR

## 2021-01-12 LAB — POCT RAPID STREP A, ED / UC: Streptococcus, Group A Screen (Direct): POSITIVE — AB

## 2021-01-12 MED ORDER — ONDANSETRON 4 MG PO TBDP
4.0000 mg | ORAL_TABLET | Freq: Once | ORAL | Status: AC
  Administered 2021-01-12: 4 mg via ORAL

## 2021-01-12 MED ORDER — ONDANSETRON 4 MG PO TBDP
ORAL_TABLET | ORAL | Status: AC
  Filled 2021-01-12: qty 1

## 2021-01-12 MED ORDER — AMOXICILLIN 500 MG PO CAPS: 500.0000 mg | ORAL_CAPSULE | Freq: Two times a day (BID) | ORAL | 0 refills | Status: DC

## 2021-01-12 MED ORDER — ONDANSETRON HCL 4 MG PO TABS: 4.0000 mg | ORAL_TABLET | Freq: Three times a day (TID) | ORAL | 0 refills | Status: DC | PRN

## 2021-01-12 NOTE — ED Provider Notes (Signed)
[facility]    CSN: [id number] Arrival date & time: 01/12/21  2751      History   Chief Complaint Chief Complaint  Patient presents with   Emesis   Generalized Body Aches   Back Pain   Neck Pain    HPI Michelle Yang is a 55 y.o. female. She reports a sore throat since 01/09/21, associated with body aches, chills. Has not taken her temperature but thinks she had a fever. Denies nasal congestion or cough. Reports nausea yesterday and emesis x 2 today. Denies abdominal pain. Diarrhea this morning. Did not get flu vaccine.    Emesis Associated symptoms: chills, diarrhea, fever and sore throat   Associated symptoms: no abdominal pain and no cough   Back Pain Associated symptoms: fever   Associated symptoms: no abdominal pain   Neck Pain Associated symptoms: fever    Past Medical History:  Diagnosis Date   Medical history non-contributory    Stroke [facility])     Patient Active Problem List   Diagnosis Date Noted   TIA (transient ischemic attack) 11/17/2019   Weakness of right side of body 11/17/2019   Elevated blood pressure reading without diagnosis of hypertension 11/17/2019   Abnormal serum creatinine level 11/17/2019   Elevated glucose 11/17/2019   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 Authorizing Provider  amoxicillin (AMOXIL) 500 MG capsule Take 1 capsule (500 mg total) by mouth 2 (two) times daily. 01/12/21  Yes Cathlyn Parsons, NP  ondansetron (ZOFRAN) 4 MG tablet Take 1 tablet (4 mg total) by mouth every 8 (eight) hours as needed for nausea  or vomiting. 01/12/21  Yes Cathlyn Parsons, NP  amLODipine (NORVASC) 5 MG tablet Take 1 tablet (5 mg total) by mouth daily. Patient not taking: Reported on 07/02/2020 03/16/20   Wieters, Fran Lowes C, PA-C  diphenhydrAMINE HCl (BENADRYL ALLERGY PO) Take 1 tablet by mouth every 5 (five) hours as needed (rash).    [provider]  famotidine (PEPCID) 20 MG tablet Take 1 tablet (20 mg total) by mouth 2 (two) times daily. 07/03/20   Bing Neighbors, FNP  loratadine (CLARITIN) 10 MG tablet Take 1 tablet (10 mg total) by mouth daily. 07/02/20 08/01/20  Roylene Reason, MD  permethrin (ELIMITE) 5 % cream Apply head to toe, leave on for 8 to 10 hours and rinse off in morning Patient not taking: Reported on 07/02/2020 06/30/20   Wieters, Hallie C, PA-C  predniSONE (DELTASONE) 10 MG tablet Begin with 6 tabs on day 1, 5 tab on day 2, 4 tab on day 3, 3 tab on day 4, 2 tab on day 5, 1 tab on day 6-take with food 06/30/20   Wieters, Hallie C, PA-C  triamcinolone (KENALOG) 0.025 % cream Apply 1 application topically 2 (  two) times daily as needed (Apply to affected areas twice daily as needed). 07/03/20   Bing Neighbors, FNP  atorvastatin (LIPITOR) 80 MG tablet Take 1 tablet (80 mg total) by mouth daily. 11/19/19 03/16/20  Joseph Art, DO    Family History Family History  Problem Relation Age of Onset   Diabetes Father     Social History Social History   Tobacco Use   Smoking status: Never   Smokeless tobacco: Never  Substance Use Topics   Alcohol use: No   Drug use: No     Allergies   Patient has no known allergies.   Review of Systems Review of Systems  Constitutional:  Positive for appetite change, chills and fever.  HENT:  Positive for sore throat. Negative for congestion, ear pain, postnasal drip and rhinorrhea.   Respiratory:  Negative for cough, shortness of breath and wheezing.   Gastrointestinal:  Positive for diarrhea, nausea and vomiting. Negative for abdominal pain.   Musculoskeletal:  Positive for back pain and neck pain.    Physical Exam Triage Vital Signs ED Triage Vitals  Enc Vitals Group     BP 01/12/21 0829 (!) 143/102     Pulse Rate 01/12/21 0829 100     Resp 01/12/21 0829 20     Temp 01/12/21 0829 99.4 F (37.4 C)     Temp src --      SpO2 01/12/21 0829 95 %     Weight --      Height --      Head Circumference --      Peak Flow --      Pain Score 01/12/21 0826 10     Pain Loc --      Pain Edu? --      Excl. in GC? --    No data found.  Updated Vital Signs BP (!) 143/102   Pulse 100   Temp 99.4 F (37.4 C)   Resp 20   LMP 01/13/2017 Comment: saturday  SpO2 95%   Visual Acuity Right Eye Distance:   Left Eye Distance:   Bilateral Distance:    Right Eye Near:   Left Eye Near:    Bilateral Near:     Physical Exam Constitutional:      Appearance: Normal appearance. She is obese. She is ill-appearing.  HENT:     Right Ear: External ear normal. There is impacted cerumen.     Left Ear: Tympanic membrane, ear canal and external ear normal.     Nose: No congestion.     Mouth/Throat:     Mouth: Mucous membranes are moist.     Pharynx: Oropharyngeal exudate and posterior oropharyngeal erythema present.     Tonsils: Tonsillar exudate present. 2+ on the right. 2+ on the left.  Neck:     Comments: B submandibular lymphadenopathy Cardiovascular:     Rate and Rhythm: Normal rate and regular rhythm.  Pulmonary:     Effort: Pulmonary effort is normal.     Breath sounds: Normal breath sounds.  Musculoskeletal:     Cervical back: No rigidity or tenderness.  Neurological:     Mental Status: She is alert.     UC Treatments / Results  Labs (all labs ordered are listed, but only abnormal results are displayed) Labs Reviewed  POCT RAPID STREP A, ED / UC - Abnormal; Notable for the following components:      Result Value   Streptococcus, Group A Screen (Direct) POSITIVE (*)  All other components within normal limits   RESPIRATORY PANEL BY PCR    EKG   Radiology No results found.  Procedures Procedures (including critical care time)  Medications Ordered in UC Medications  ondansetron (ZOFRAN-ODT) disintegrating tablet 4 mg (4 mg Oral Given 01/12/21 0853)    Initial Impression / Assessment and Plan / UC Course  I have reviewed the triage vital signs and the nursing notes.  Pertinent labs & imaging results that were available during my care of the patient were reviewed by me and considered in my medical decision making (see chart for details).    Strep positive. Rx amoxicillin. Since pt has N/V, rx zofran. Reviewed supportive care measures. Reviewed normal course of illness. Given note for work.   Final Clinical Impressions(s) / UC Diagnoses   Final diagnoses:  Streptococcal sore throat   Discharge Instructions   None    ED Prescriptions     Medication Sig Dispense Auth. Provider   amoxicillin (AMOXIL) 500 MG capsule Take 1 capsule (500 mg total) by mouth 2 (two) times daily. 20 capsule Cathlyn Parsons, NP   ondansetron (ZOFRAN) 4 MG tablet Take 1 tablet (4 mg total) by mouth every 8 (eight) hours as needed for nausea or vomiting. 20 tablet Cathlyn Parsons, NP      PDMP not reviewed this encounter.   Cathlyn Parsons, NP 01/12/21 [phone]

## 2021-01-12 NOTE — ED Triage Notes (Signed)
Pt reports back pain ,neck pain ,sore throat and body aches since yesterday.

## 2021-03-09 ENCOUNTER — Other Ambulatory Visit: Payer: Self-pay

## 2021-03-09 ENCOUNTER — Emergency Department (HOSPITAL_COMMUNITY)
Admission: EM | Admit: 2021-03-09 | Discharge: 2021-03-09 | Disposition: A | Discharge status: Home / Self Care | Payer: BC Managed Care – PPO | Attending: Emergency Medicine | Admitting: Emergency Medicine

## 2021-03-09 ENCOUNTER — Emergency Department (HOSPITAL_COMMUNITY): Payer: BC Managed Care – PPO

## 2021-03-09 DIAGNOSIS — M62838 Other muscle spasm: Secondary | ICD-10-CM

## 2021-03-09 DIAGNOSIS — Z20822 Contact with and (suspected) exposure to covid-19: Secondary | ICD-10-CM | POA: Insufficient documentation

## 2021-03-09 DIAGNOSIS — R202 Paresthesia of skin: Secondary | ICD-10-CM | POA: Diagnosis present

## 2021-03-09 DIAGNOSIS — R930 Abnormal findings on diagnostic imaging of skull and head, not elsewhere classified: Secondary | ICD-10-CM | POA: Diagnosis not present

## 2021-03-09 DIAGNOSIS — Z8673 Personal history of transient ischemic attack (TIA), and cerebral infarction without residual deficits: Secondary | ICD-10-CM | POA: Insufficient documentation

## 2021-03-09 LAB — CBC
HCT: 45.5 % (ref 36.0–46.0)
Hemoglobin: 14.5 g/dL (ref 12.0–15.0)
MCH: 27 pg (ref 26.0–34.0)
MCHC: 31.9 g/dL (ref 30.0–36.0)
MCV: 84.6 fL (ref 80.0–100.0)
Platelets: 285 10*3/uL (ref 150–400)
RBC: 5.38 MIL/uL — ABNORMAL HIGH (ref 3.87–5.11)
RDW: 14.6 % (ref 11.5–15.5)
WBC: 5.3 10*3/uL (ref 4.0–10.5)
nRBC: 0 % (ref 0.0–0.2)

## 2021-03-09 LAB — URINALYSIS, ROUTINE W REFLEX MICROSCOPIC
Bilirubin Urine: NEGATIVE
Glucose, UA: NEGATIVE mg/dL
Ketones, ur: 5 mg/dL — AB
Nitrite: NEGATIVE
Protein, ur: NEGATIVE mg/dL
Specific Gravity, Urine: 1.033 — ABNORMAL HIGH (ref 1.005–1.030)
pH: 6 (ref 5.0–8.0)

## 2021-03-09 LAB — DIFFERENTIAL
Abs Immature Granulocytes: 0.01 10*3/uL (ref 0.00–0.07)
Basophils Absolute: 0 10*3/uL (ref 0.0–0.1)
Basophils Relative: 1 %
Eosinophils Absolute: 0.3 10*3/uL (ref 0.0–0.5)
Eosinophils Relative: 5 %
Immature Granulocytes: 0 %
Lymphocytes Relative: 39 %
Lymphs Abs: 2.1 10*3/uL (ref 0.7–4.0)
Monocytes Absolute: 0.3 10*3/uL (ref 0.1–1.0)
Monocytes Relative: 6 %
Neutro Abs: 2.6 10*3/uL (ref 1.7–7.7)
Neutrophils Relative %: 49 %

## 2021-03-09 LAB — RAPID URINE DRUG SCREEN, HOSP PERFORMED
Amphetamines: NOT DETECTED
Barbiturates: NOT DETECTED
Benzodiazepines: NOT DETECTED
Cocaine: NOT DETECTED
Opiates: NOT DETECTED
Tetrahydrocannabinol: NOT DETECTED

## 2021-03-09 LAB — COMPREHENSIVE METABOLIC PANEL
ALT: 33 U/L (ref 0–44)
AST: 25 U/L (ref 15–41)
Albumin: 4 g/dL (ref 3.5–5.0)
Alkaline Phosphatase: 68 U/L (ref 38–126)
Anion gap: 7 (ref 5–15)
BUN: 14 mg/dL (ref 6–20)
CO2: 24 mmol/L (ref 22–32)
Calcium: 9.6 mg/dL (ref 8.9–10.3)
Chloride: 106 mmol/L (ref 98–111)
Creatinine, Ser: 0.73 mg/dL (ref 0.44–1.00)
GFR, Estimated: >60 mL/min (ref >60)
Glucose, Bld: 98 mg/dL (ref 70–99)
Potassium: 3.8 mmol/L (ref 3.5–5.1)
Sodium: 137 mmol/L (ref 135–145)
Total Bilirubin: 0.8 mg/dL (ref 0.3–1.2)
Total Protein: 7.7 g/dL (ref 6.5–8.1)

## 2021-03-09 LAB — I-STAT CHEM 8, ED
BUN: 17 mg/dL (ref 6–20)
Calcium, Ion: 1.15 mmol/L (ref 1.15–1.40)
Chloride: 108 mmol/L (ref 98–111)
Creatinine, Ser: 0.6 mg/dL (ref 0.44–1.00)
Glucose, Bld: 95 mg/dL (ref 70–99)
HCT: 46 % (ref 36.0–46.0)
Hemoglobin: 15.6 g/dL — ABNORMAL HIGH (ref 12.0–15.0)
Potassium: 4 mmol/L (ref 3.5–5.1)
Sodium: 141 mmol/L (ref 135–145)
TCO2: 26 mmol/L (ref 22–32)

## 2021-03-09 LAB — RESP PANEL BY RT-PCR (FLU A&B, COVID) ARPGX2
Influenza A by PCR: NEGATIVE
Influenza B by PCR: NEGATIVE
SARS Coronavirus 2 by RT PCR: NEGATIVE

## 2021-03-09 LAB — CBG MONITORING, ED: Glucose-Capillary: 95 mg/dL (ref 70–99)

## 2021-03-09 LAB — APTT: aPTT: 27 seconds (ref 24–36)

## 2021-03-09 LAB — I-STAT BETA HCG BLOOD, ED (MC, WL, AP ONLY): I-stat hCG, quantitative: <5 m[IU]/mL (ref <5)

## 2021-03-09 LAB — PROTIME-INR
INR: 0.9 (ref 0.8–1.2)
Prothrombin Time: 12 seconds (ref 11.4–15.2)

## 2021-03-09 LAB — ETHANOL: Alcohol, Ethyl (B): <10 mg/dL (ref <10)

## 2021-03-09 IMAGING — MR MR HEAD W/O CM
6 of 11 series · 24 of 48 positions shown · non-contrast
Comparison: Same-day CT/CTA head, brain MRI [DATE]

CLINICAL DATA: Neuro deficit

EXAM:
MRI HEAD WITHOUT CONTRAST
TECHNIQUE: Multiplanar, multiecho pulse sequences of the brain and surrounding
structures were obtained without intravenous contrast.

[Series 2: DWI · axial · 3.0mm · 0.94mm/px · z∈[-63,+90]mm · 7 of 103 slices shown (1 of 2)]
[im 1/103]
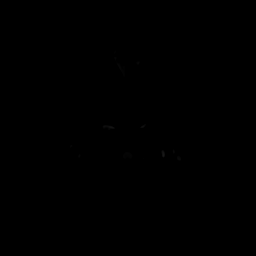
[im 18/103]
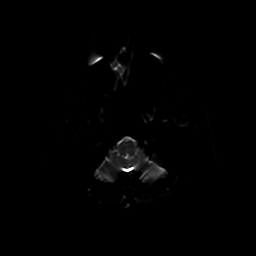
[im 35/103]
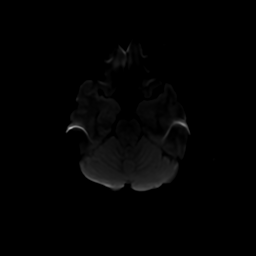
[im 52/103]
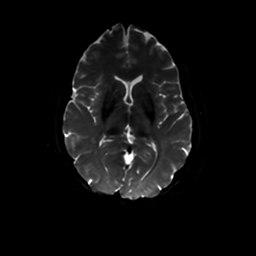
[im 69/103]
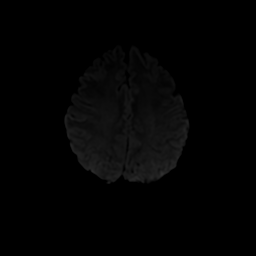
[im 86/103]
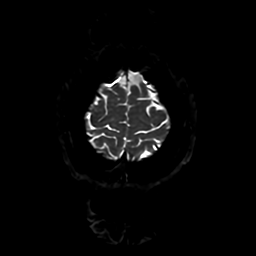
[im 103/103]
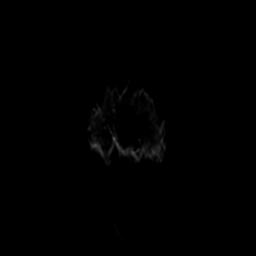

[Series 3: DWI · coronal · 4.0mm · 0.94mm/px · 5 of 74 slices shown (2 of 2)]
[im 1/74]
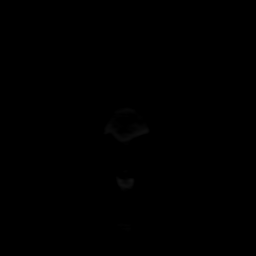
[im 19/74]
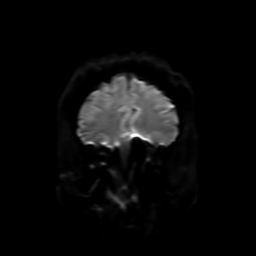
[im 37/74]
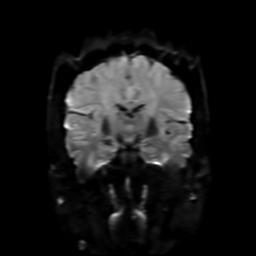
[im 55/74]
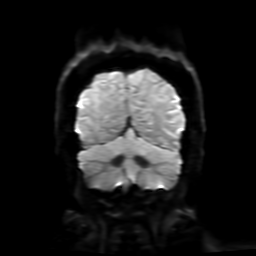
[im 74/74]
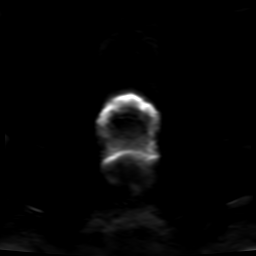

[Series 4: FLAIR · sagittal · 5.0mm · 0.23mm/px · 2 of 25 slices shown (1 of 2)]
[im 1/25]
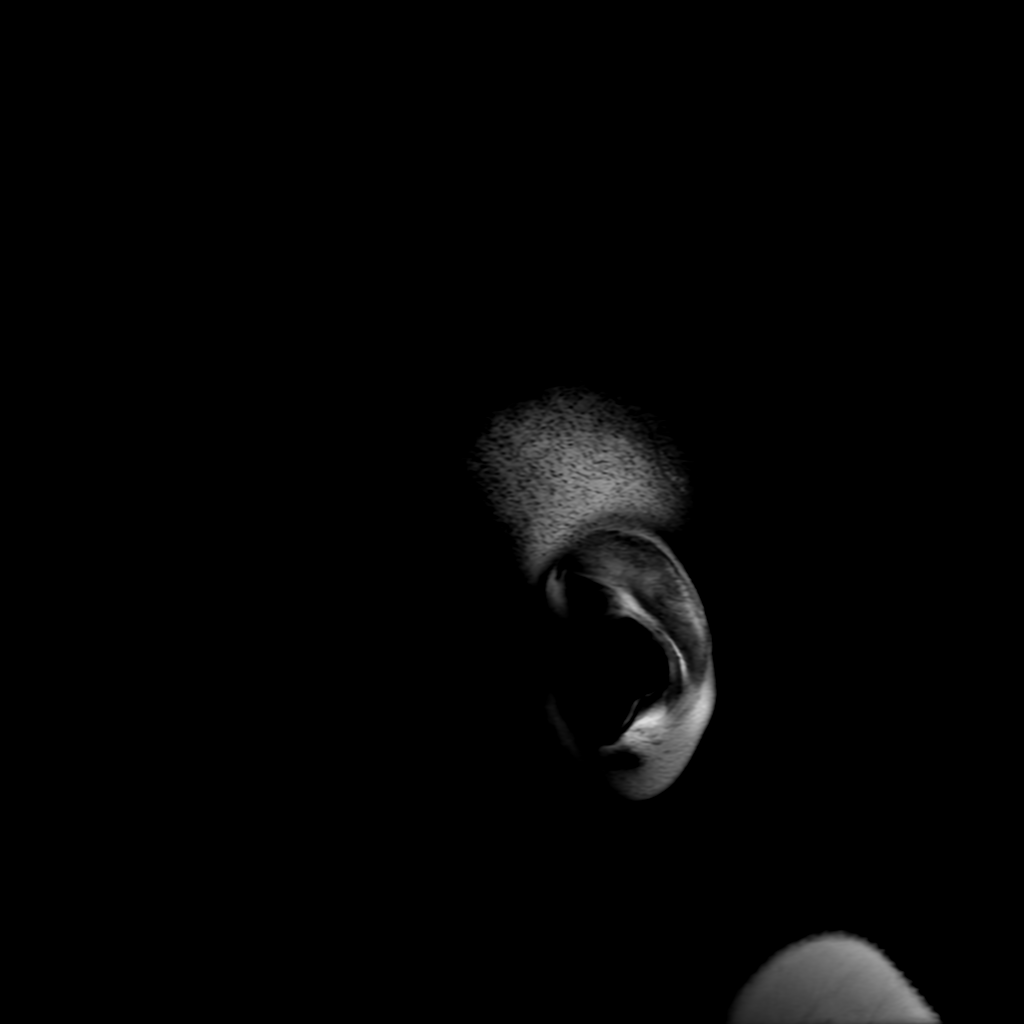
[im 25/25]
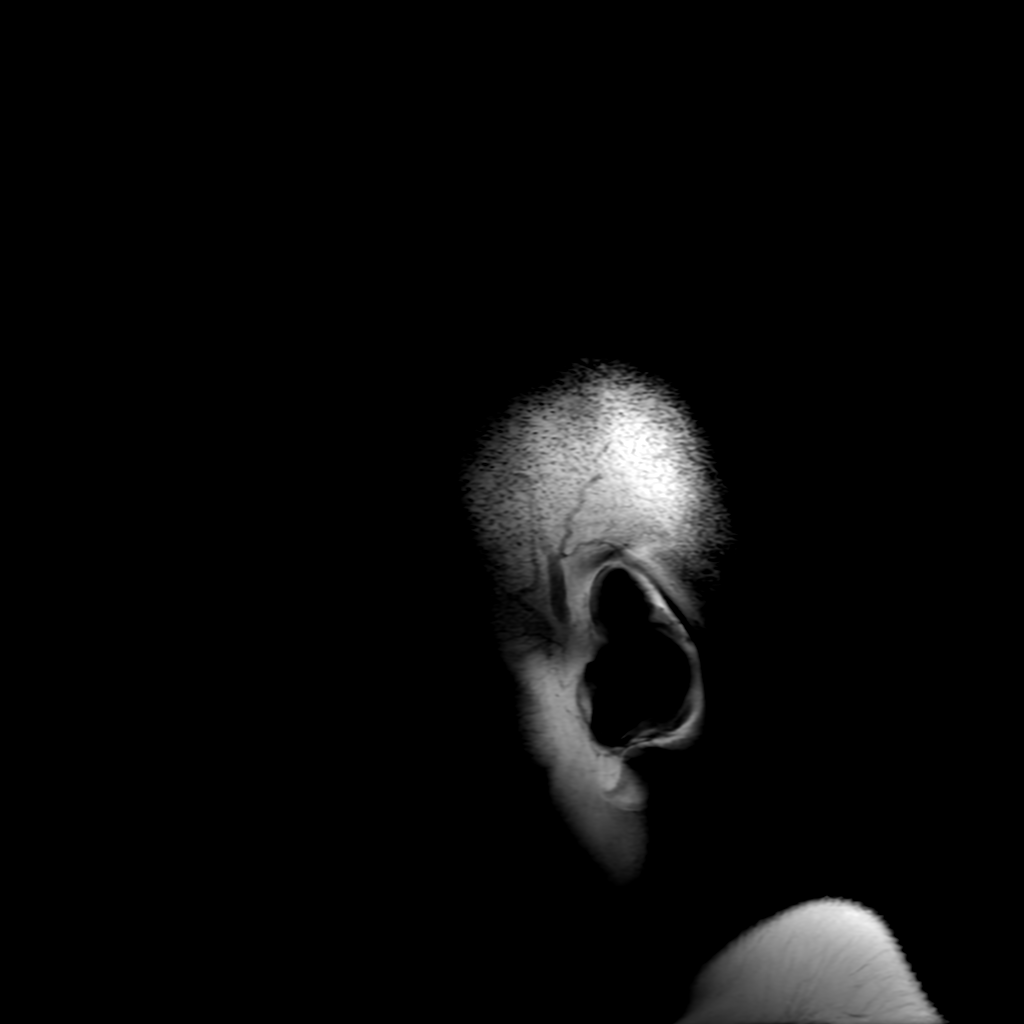

[Series 6: FLAIR · axial · 4.0mm · 0.45mm/px · z∈[-82,+75]mm · 3 of 37 slices shown (2 of 2)]
[im 1/37]
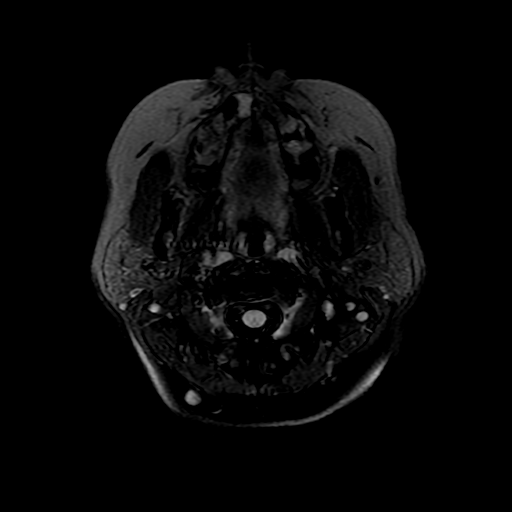
[im 19/37]
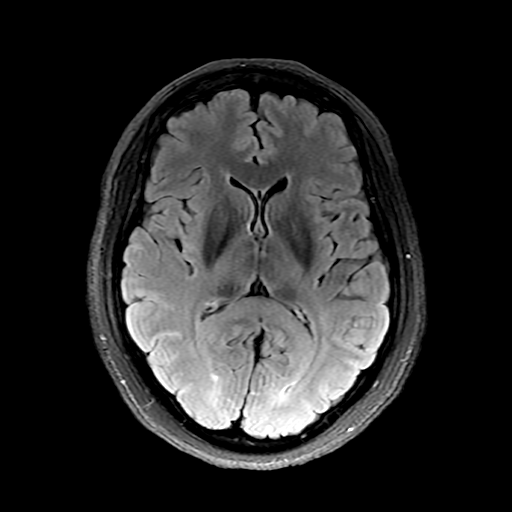
[im 37/37]
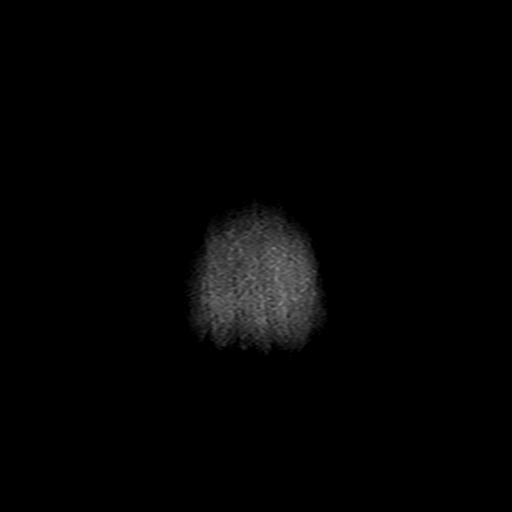

[Series 250: ADC · axial · 3.0mm · 0.94mm/px · z∈[-63,+90]mm · 4 of 52 slices shown (1 of 2)]
[im 1/52]
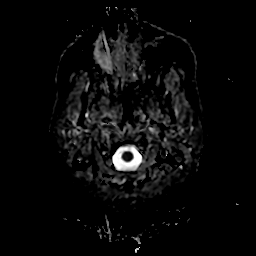
[im 18/52]
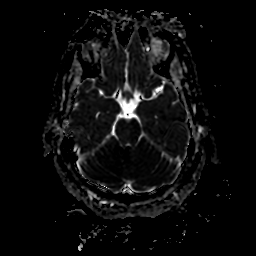
[im 35/52]
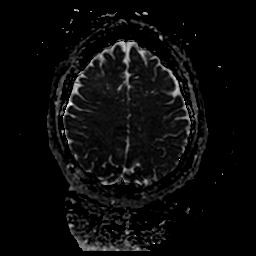
[im 52/52]
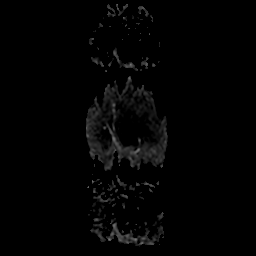

[Series 350: ADC · coronal · 4.0mm · 0.94mm/px · 3 of 37 slices shown (2 of 2)]
[im 1/37]
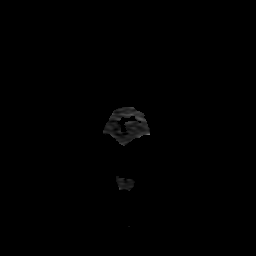
[im 19/37]
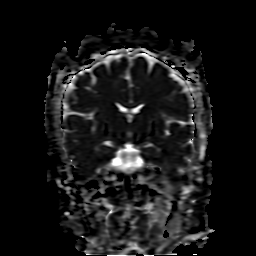
[im 37/37]
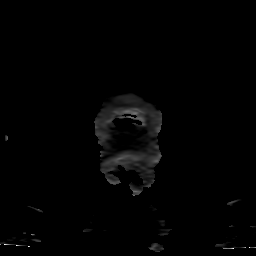

[24 of 48 positions shown; findings below may reference images not displayed]

FINDINGS: Brain: There is no evidence of acute intracranial hemorrhage,
extra-axial fluid collection, or acute infarct.

Parenchymal volume is normal. The ventricles are normal in size. The
parenchyma is normal in appearance, with no significant burden of
white matter microangiopathic change.

There is no mass lesion.  There is no midline shift.

Vascular: Normal flow voids.

Skull and upper cervical spine: Normal marrow signal.

Sinuses/Orbits: There is moderate mucosal thickening in the right
maxillary sinus and ethmoid air cells. The globes and orbits are
unremarkable.

Other: None.
IMPRESSION: Normal appearance of the brain with no acute intracranial pathology.

## 2021-03-09 IMAGING — CT CT HEAD CODE STROKE
4 series · 16 of 47 positions shown, 18 images · non-contrast
Comparison: [DATE]

CLINICAL DATA: Code stroke.  Neuro deficit, acute, stroke suspected



[Series 3: head wo · axial · 0.41mm/px · z∈[-70,+46]mm · 7 of 31 slices shown, 9 images]
[im 4/31  brain]
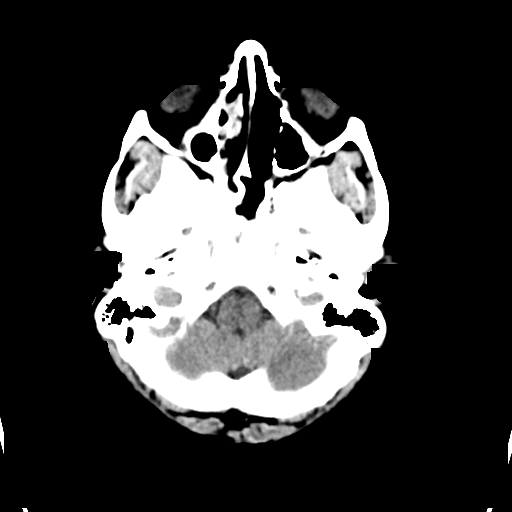
[im 4/31  bone]
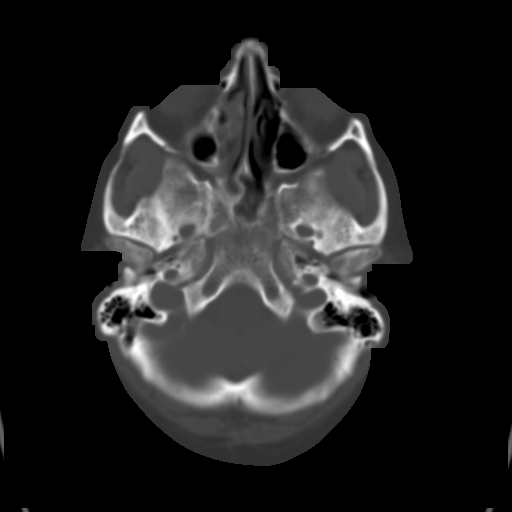
[im 8/31  brain]
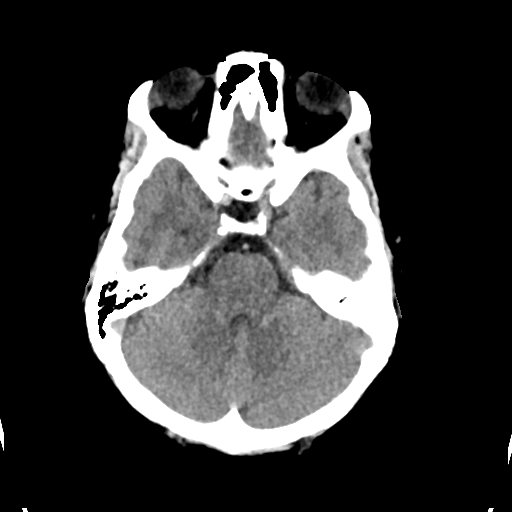
[im 12/31  brain]
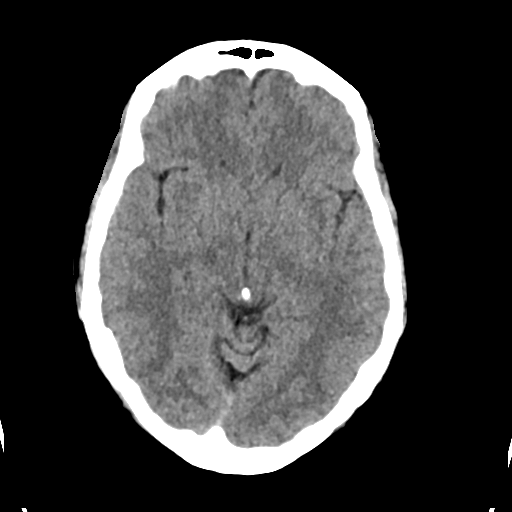
[im 16/31  brain]
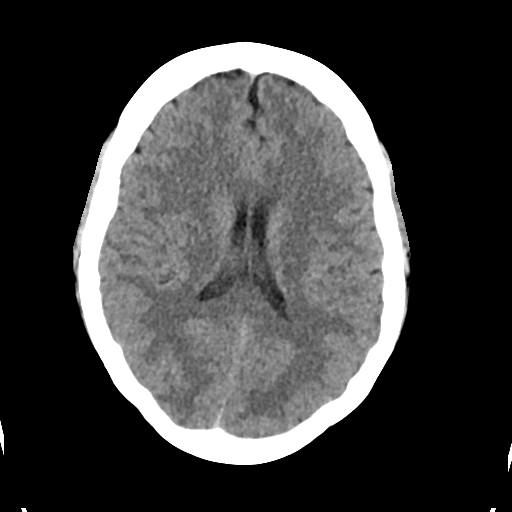
[im 19/31  brain]
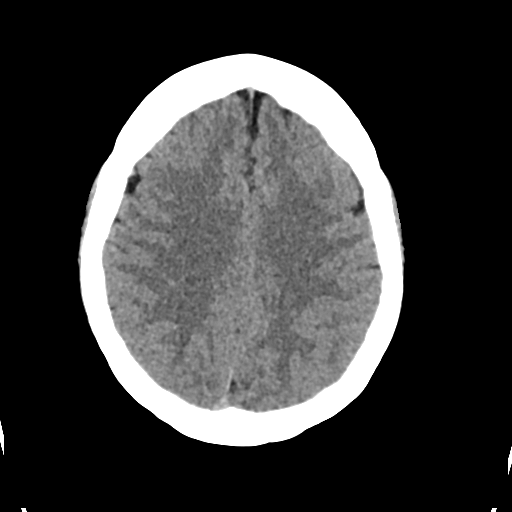
[im 19/31  bone]
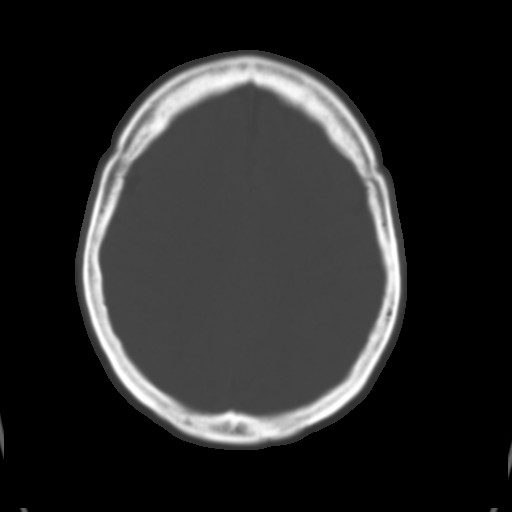
[im 23/31  brain]
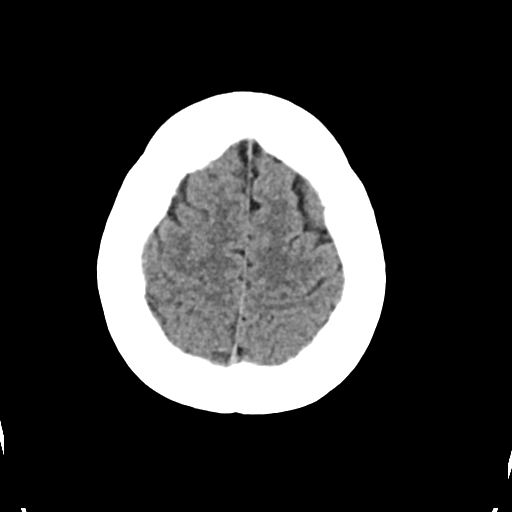
[im 27/31  brain]
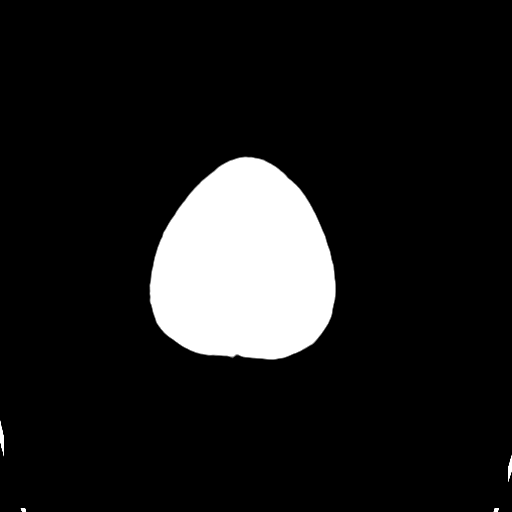

[Series 4: head bone · axial · 0.41mm/px · z∈[-70,-40]mm · 3 of 77 slices shown]
[im 8/77  bone]
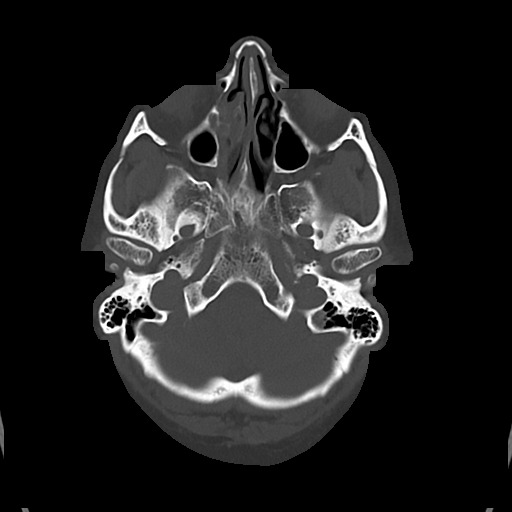
[im 16/77  bone]
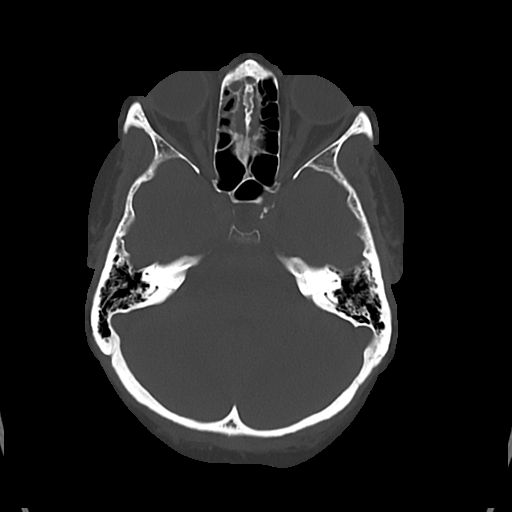
[im 23/77  bone]
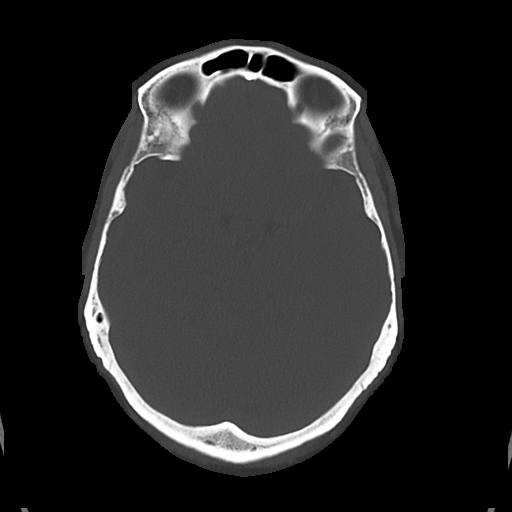

[Series 5: cor soft · coronal · 0.30mm/px · 3 of 67 slices shown]
[im 23/67  brain]
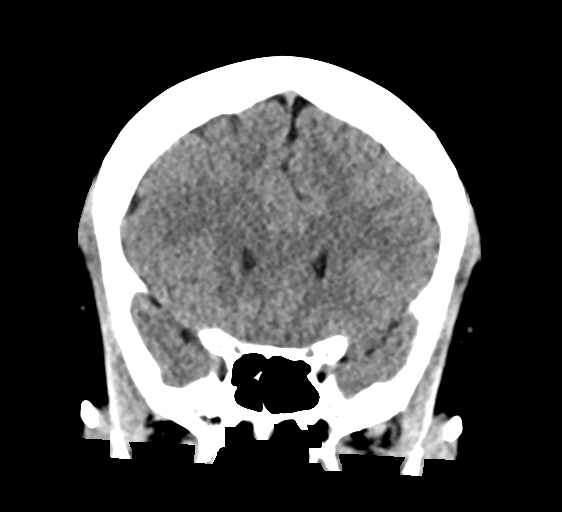
[im 30/67  brain]
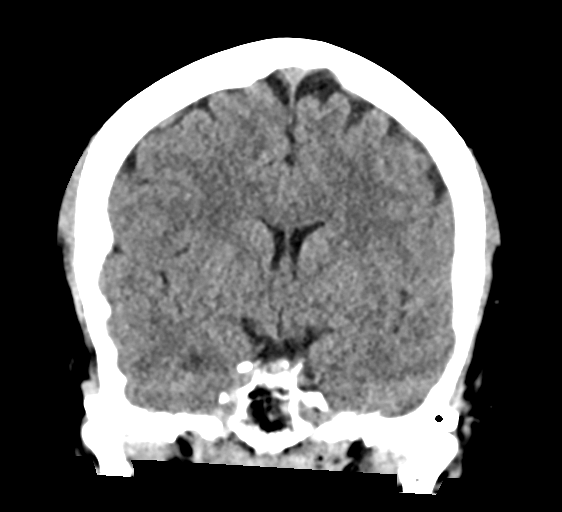
[im 37/67  brain]
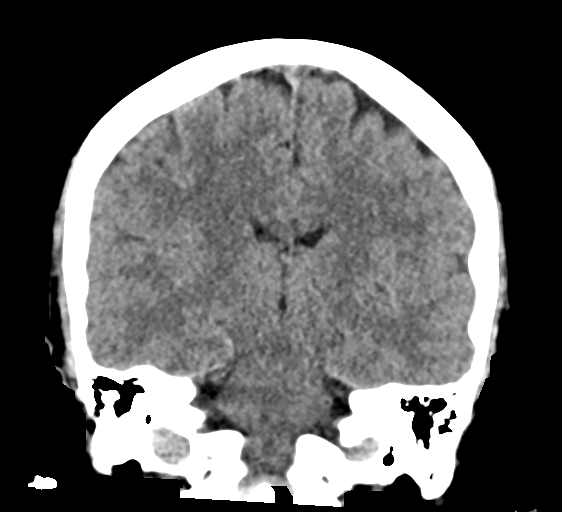

[Series 6: sag soft · sagittal · 0.31mm/px · 3 of 57 slices shown]
[im 19/57  brain]
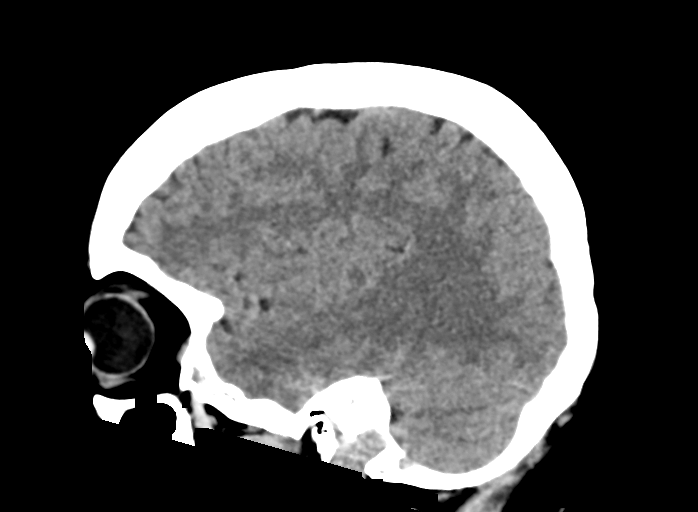
[im 29/57  brain]
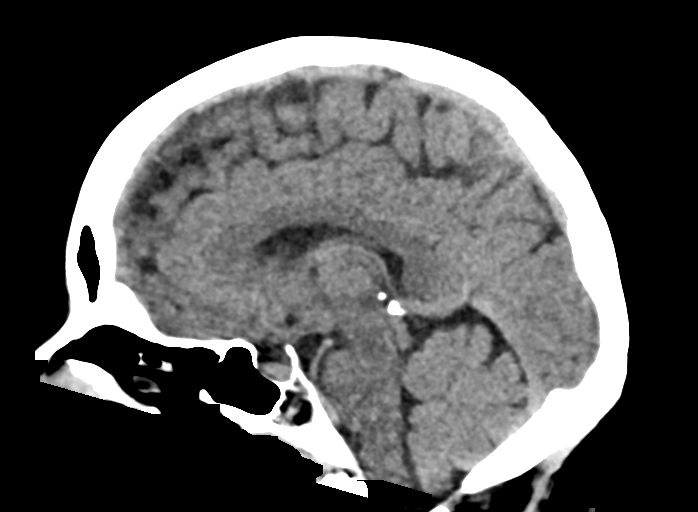
[im 38/57  brain]
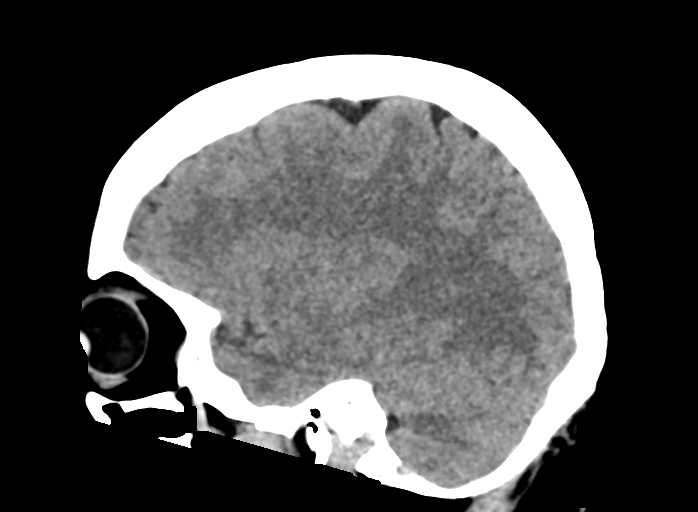

[16 of 47 positions shown; findings below may reference images not displayed]

FINDINGS: Brain: No acute intracranial hemorrhage, mass effect, or edema.
Gray-white differentiation is preserved. Ventricles and sulci are
normal in size and configuration. No extra-axial collection.

Vascular: No hyperdense vessel.

Skull: Unremarkable.

Sinuses/Orbits: Paranasal sinus mucosal thickening the right.
Unremarkable orbits.

Other: Mastoid air cells are clear.

ASPECTS (Alberta Stroke Program Early CT Score)

- Ganglionic level infarction (caudate, lentiform nuclei, internal
capsule, insula, M1-M3 cortex): 7

- Supraganglionic infarction (M4-M6 cortex): 3

Total score (0-10 with 10 being normal): 10
IMPRESSION: There is no acute intracranial hemorrhage or evidence of acute
infarction. ASPECT score is 10.

These results were communicated to Dr. V D WESTHUIZEN at [DATE] on [DATE]
by text page via the [employer] messaging system.

## 2021-03-09 MED ORDER — KETOROLAC TROMETHAMINE 30 MG/ML IJ SOLN
30.0000 mg | Freq: Once | INTRAMUSCULAR | Status: DC
  Filled 2021-03-09: qty 1

## 2021-03-09 MED ORDER — IOHEXOL 350 MG/ML SOLN
100.0000 mL | Freq: Once | INTRAVENOUS | Status: AC | PRN
  Administered 2021-03-09: 14:00:00 100 mL via INTRAVENOUS

## 2021-03-09 MED ORDER — IBUPROFEN 600 MG PO TABS: 600.0000 mg | ORAL_TABLET | Freq: Four times a day (QID) | ORAL | 0 refills | Status: DC | PRN

## 2021-03-09 MED ORDER — METHOCARBAMOL 500 MG PO TABS: 500.0000 mg | ORAL_TABLET | Freq: Two times a day (BID) | ORAL | 0 refills | Status: DC

## 2021-03-09 MED ORDER — METOCLOPRAMIDE HCL 5 MG/ML IJ SOLN
10.0000 mg | Freq: Once | INTRAMUSCULAR | Status: DC
  Filled 2021-03-09: qty 2

## 2021-03-09 NOTE — ED Provider Triage Note (Signed)
Emergency Medicine Provider Triage Evaluation Note  Michelle Yang , a 56 y.o. female  was evaluated in triage.  Pt complains of right upper and lower extremity weakness and numbness since Monday.  Patient has history of stroke.  Patient states since Monday the right lower the pressure any weakness has had some episodes of relief, however it always returns.  Patient states that this morning around 8 AM the symptoms began again which prompted her to report to the ED.  Review of Systems  Positive: Right lower extremity weakness, right upper extremity weakness, right lower and upper extremity numbness Negative: Chest pain, shortness of breath, abdominal pain  Physical Exam  BP (!) 174/115 (BP Location: Left Arm)    Pulse (!) 106    Temp 98.1 F (36.7 C) (Oral)    Resp 18    LMP 01/13/2017 Comment: saturday   SpO2 98%  Gen:   Awake, no distress   Resp:  Normal effort  MSK:   Moves extremities without difficulty  Other:  Patient has RLE and RUE weakness and numbness.  Medical Decision Making  Medically screening exam initiated at 1:12 PM.  Appropriate orders placed.  Michelle Yang was informed that the remainder of the evaluation will be completed by another provider, this initial triage assessment does not replace that evaluation, and the importance of remaining in the ED until their evaluation is complete.  Code stroke activated   Azucena Cecil, [place] 03/09/21 1313

## 2021-03-09 NOTE — ED Provider Notes (Signed)
[facility] DEPART[facility] Provider Note    CSN: [id number] Arrival date & time: 03/09/21 1151  History Chief Complaint  Patient presents with   Numbness    Michelle Yang is a 56 y.o. female with history of prior stroke, reports some numbness on R side 2 days ago that went away. Returned this morning around 8am while at work. She Reports she felt dizzy at the time. She denies any weakness. Drove herself here for evaluation. Code Stroke activated in triage.    Home Medications Prior to Admission medications   Medication Sig Start Date End Date Taking? Authorizing Provider  ibuprofen (ADVIL) 600 MG tablet Take 1 tablet (600 mg total) by mouth every 6 (six) hours as needed. 03/09/21  Yes Truddie Hidden, MD  methocarbamol (ROBAXIN) 500 MG tablet Take 1 tablet (500 mg total) by mouth 2 (two) times daily. 03/09/21  Yes Truddie Hidden, MD  amLODipine (NORVASC) 5 MG tablet Take 1 tablet (5 mg total) by mouth daily. Patient not taking: Reported on 07/02/2020 03/16/20   Wieters, Madelynn Done C, PA-C  diphenhydrAMINE HCl (BENADRYL ALLERGY PO) Take 1 tablet by mouth every 5 (five) hours as needed (rash).    [provider]  famotidine (PEPCID) 20 MG tablet Take 1 tablet (20 mg total) by mouth 2 (two) times daily. 07/03/20   Scot Jun, FNP  loratadine (CLARITIN) 10 MG tablet Take 1 tablet (10 mg total) by mouth daily. 07/02/20 08/01/20  Cato Mulligan, MD  ondansetron (ZOFRAN) 4 MG tablet Take 1 tablet (4 mg total) by mouth every 8 (eight) hours as needed for nausea or vomiting. 01/12/21   Carvel Getting, NP  permethrin (ELIMITE) 5 % cream Apply head to toe, leave on for 8 to 10 hours and rinse off in morning Patient not taking: Reported on 07/02/2020 06/30/20   Wieters, Hallie C, PA-C  triamcinolone (KENALOG) 0.025 % cream Apply 1 application topically 2 (two) times daily as needed (Apply to affected areas twice daily as needed). 07/03/20   Scot Jun, FNP   atorvastatin (LIPITOR) 80 MG tablet Take 1 tablet (80 mg total) by mouth daily. 11/19/19 03/16/20  Geradine Girt, DO     Allergies    Patient has no known allergies.   Review of Systems   Review of Systems Please see HPI for pertinent positives and negatives  Physical Exam BP (!) 154/96    Pulse 91    Temp 98.1 F (36.7 C) (Oral)    Resp 18    LMP 01/13/2017 Comment: saturday   SpO2 98%   Physical Exam Vitals and nursing note reviewed.  Constitutional:      Appearance: Normal appearance.  HENT:     Head: Normocephalic and atraumatic.     Nose: Nose normal.     Mouth/Throat:     Mouth: Mucous membranes are moist.  Eyes:     Extraocular Movements: Extraocular movements intact.     Conjunctiva/sclera: Conjunctivae normal.  Cardiovascular:     Rate and Rhythm: Normal rate.  Pulmonary:     Effort: Pulmonary effort is normal.     Breath sounds: Normal breath sounds.  Abdominal:     General: Abdomen is flat.     Palpations: Abdomen is soft.     Tenderness: There is no abdominal tenderness.  Musculoskeletal:        General: No swelling. Normal range of motion.     Cervical back: Neck supple.  Skin:  General: Skin is warm and dry.  Neurological:     Mental Status: She is alert.     Comments: Numbness on R arm/leg, no other obvious focal deficits, for full NIHSS, please see Neuro notes.   Psychiatric:        Mood and Affect: Mood normal.    ED Results / Procedures / Treatments   EKG EKG Interpretation  Date/Time:  Wednesday March 09 2021 13:57:23 EST Ventricular Rate:  92 PR Interval:  161 QRS Duration: 77 QT Interval:  366 QTC Calculation: 453 R Axis:   159 Text Interpretation: Right and left arm electrode reversal, interpretation assumes no reversal Sinus or ectopic atrial rhythm Probable lateral infarct, age indeterminate No significant change since last tracing EARLIER SAME DATE Confirmed by Calvert Cantor [phone]) on 03/09/2021 2:26:07  PM  Procedures Procedures  Medications Ordered in the ED Medications  ketorolac (TORADOL) 30 MG/ML injection 30 mg (30 mg Intravenous Patient Refused/Not Given 03/09/21 1427)  metoCLOPramide (REGLAN) injection 10 mg (10 mg Intravenous Patient Refused/Not Given 03/09/21 1427)  iohexol (OMNIPAQUE) 350 MG/ML injection 100 mL (100 mLs Intravenous Contrast Given 03/09/21 1346)    Initial Impression and Plan Patient with numbness, similar to previous stroke activated as a Code Stroke from triage with order set begun there as well. She is outside the TNK window. Neurology promptly at bedside to evaluate.   ED Course   Clinical Course as of 03/09/21 1547  Wed Mar 09, 2021  1354 Neurology has evaluated the patient, suspect complex migraine. Recommend MRI and if negative can go home.  [CS]  1406 CBC, CMP, coags, EtOH all normal. CT head images reviewed and neg.  [CS]  1458 UA unremarkable.  [CS]  [id number] UDS is neg.  [CS]  [id number] MRI is neg. Patient is reporting some 'tightness' in her R shoulder/upper chest area. Suspect this is an MSK problem. She has not other concerning findings on her ED workup today. No indication for admission. Will treat with NSAIDs, muscle relaxer and PCP follow up.  [CS]    Clinical Course User Index [CS] Truddie Hidden, MD     MDM Rules/Calculators/A&P Medical Decision Making Problems Addressed: Muscle spasm: acute illness or injury with systemic symptoms Paresthesia: acute illness or injury that poses a threat to life or bodily functions  Amount and/or Complexity of Data Reviewed Labs: ordered. Decision-making details documented in ED Course. Radiology: ordered and independent interpretation performed. Decision-making details documented in ED Course. ECG/medicine tests: ordered and independent interpretation performed. Decision-making details documented in ED Course.  Risk Prescription drug management. Decision regarding hospitalization.    Final Clinical  Impression(s) / ED Diagnoses Final diagnoses:  Paresthesia  Muscle spasm    Rx / DC Orders ED Discharge Orders          Ordered    ibuprofen (ADVIL) 600 MG tablet  Every 6 hours PRN        03/09/21 1547    methocarbamol (ROBAXIN) 500 MG tablet  2 times daily        03/09/21 1547             Truddie Hidden, MD 03/09/21 1547

## 2021-03-09 NOTE — ED Triage Notes (Addendum)
Pt here for R side chest tightness that radiates into R shoulder w/ R arm numbness. Pt has no drift, but has weaker grip strength on the R side and has numbness on R side. Hx of stroke, pt states this feels the same. Pt states symptoms started Monday morning, then improved and symptoms started again today at 0800

## 2021-03-09 NOTE — Consult Note (Addendum)
Neurology Consultation  Reason for Consult: Code Stroke Referring Physician: Karle Starch  CC: numbness  History is obtained from: Patient  HPI: Michelle Yang is a 56 y.o. female with a PMH of stroke (taking ASA81mg  daily), HTN, seasonal allergies, HLD, and migraines presenting with with numbness and tingling on the right side of her body that started Monday then improved slightly. She does also report experiencing nausea and dizziness during these episodes. The symptoms came back today at 0800. She is also reporting right side chest tightness that radiates into the right shoulder with right arm numbness. She was able to drive herself to the emergency department and ambulate to and from the CT table with standby assistance.    LKW: 0800 TNK given?: no,  IR Thrombectomy? No,  Modified Rankin Scale: 0-Completely asymptomatic and back to baseline post- stroke  ROS: A complete ROS was performed and is negative except as noted in the HPI.   Past Medical History:  Diagnosis Date   Medical history non-contributory    Stroke [facility])     Family History  Problem Relation Age of Onset   Diabetes Father    Social History:   reports that she has never smoked. She has never used smokeless tobacco. She reports that she does not drink alcohol and does not use drugs.  Medications  Current Facility-Administered Medications:    ketorolac (TORADOL) 30 MG/ML injection 30 mg, 30 mg, Intravenous, Once, Truddie Hidden, MD   metoCLOPramide (REGLAN) injection 10 mg, 10 mg, Intravenous, Once, Truddie Hidden, MD  Current Outpatient Medications:    amLODipine (NORVASC) 5 MG tablet, Take 1 tablet (5 mg total) by mouth daily. (Patient not taking: Reported on 07/02/2020), Disp: 60 tablet, Rfl: 0   amoxicillin (AMOXIL) 500 MG capsule, Take 1 capsule (500 mg total) by mouth 2 (two) times daily., Disp: 20 capsule, Rfl: 0   diphenhydrAMINE HCl (BENADRYL ALLERGY PO), Take 1 tablet by mouth every 5 (five)  hours as needed (rash)., Disp: , Rfl:    famotidine (PEPCID) 20 MG tablet, Take 1 tablet (20 mg total) by mouth 2 (two) times daily., Disp: 60 tablet, Rfl: 0   loratadine (CLARITIN) 10 MG tablet, Take 1 tablet (10 mg total) by mouth daily., Disp: 30 tablet, Rfl: 0   ondansetron (ZOFRAN) 4 MG tablet, Take 1 tablet (4 mg total) by mouth every 8 (eight) hours as needed for nausea or vomiting., Disp: 20 tablet, Rfl: 0   permethrin (ELIMITE) 5 % cream, Apply head to toe, leave on for 8 to 10 hours and rinse off in morning (Patient not taking: Reported on 07/02/2020), Disp: 60 g, Rfl: 0   predniSONE (DELTASONE) 10 MG tablet, Begin with 6 tabs on day 1, 5 tab on day 2, 4 tab on day 3, 3 tab on day 4, 2 tab on day 5, 1 tab on day 6-take with food, Disp: 21 tablet, Rfl: 0   triamcinolone (KENALOG) 0.025 % cream, Apply 1 application topically 2 (two) times daily as needed (Apply to affected areas twice daily as needed)., Disp: 454 g, Rfl: 0   Exam: Current vital signs: BP (!) 154/96    Pulse 91    Temp 98.1 F (36.7 C) (Oral)    Resp 18    LMP 01/13/2017 Comment: saturday   SpO2 98%  Vital signs in last 24 hours: Temp:  [98.1 F (36.7 C)] 98.1 F (36.7 C) (01/25 1254) Pulse Rate:  [91-106] 91 (01/25 1434) Resp:  [18-20] 18 (01/25  1434) BP: (154-174)/(96-117) 154/96 (01/25 1434) SpO2:  [98 %-100 %] 98 % (01/25 1434)  GENERAL: Awake, alert, in no acute distress Psych: Affect appropriate for situation, patient is calm and cooperative with examination Head: Normocephalic and atraumatic, without obvious abnormality EENT: Normal conjunctivae, dry mucous membranes, no OP obstruction LUNGS: Normal respiratory effort. Non-labored breathing on room air CV: Regular rate and rhythm on telemetry ABDOMEN: Soft, non-tender, non-distended Extremities: warm, well perfused, without obvious deformity  NEURO:  Mental Status: Awake, alert, and oriented to person, place, time, and situation. He/She is able to  provide a clear and coherent history of present illness. Speech/Language: speech is clear.   Naming, repetition, fluency, and comprehension intact without aphasia  No neglect is noted Cranial Nerves:  II: PERRL 3 mm/brisk. visual fields full.  III, IV, VI: EOMI. Lid elevation symmetric and full.  V: Sensation is intact to light touch and symmetrical to face. Blinks to threat. Moves jaw back and forth.  VII: Face is symmetric resting and smiling. Able to puff cheeks and raise eyebrows.  VIII: Hearing intact to voice IX, X: Palate elevation is symmetric. Phonation normal.  XI: Normal sternocleidomastoid and trapezius muscle strength XII: Tongue protrudes midline without fasciculations.   Motor: 5/5 strength is all extremities. Right hand grasp 4/5. Tone is normal. Bulk is normal. No drift in extremities.  Sensation: No extinction to DSS present. Numbness/tingling sensation reported on RLE and RUE with light touch. Coordination: FTN intact bilaterally. HKS intact bilaterally. No pronator drift. Alternating hand movements intact.  DTRs: 2+ throughout.  Gait: steady stand and pivot  NIHSS: 1a Level of Conscious.: 0 1b LOC Questions: 0 1c LOC Commands: 0 2 Best Gaze: 0 3 Visual: 0 4 Facial Palsy: 0 5a Motor Arm - left: 0  5b Motor Arm - Right: 0 6a Motor Leg - Left: 0 6b Motor Leg - Right: 0 7 Limb Ataxia: 0 8 Sensory: 1 9 Best Language: 0  10 Dysarthria: 0 11 Extinct. and Inatten.: 0  TOTAL: 1   Labs I have reviewed labs in epic and the results pertinent to this consultation are:  CBC    Component Value Date/Time   WBC 5.3 03/09/2021 1312   RBC 5.38 (H) 03/09/2021 1312   HGB 15.6 (H) 03/09/2021 1326   HCT 46.0 03/09/2021 1326   PLT 285 03/09/2021 1312   MCV 84.6 03/09/2021 1312   MCH 27.0 03/09/2021 1312   MCHC 31.9 03/09/2021 1312   RDW 14.6 03/09/2021 1312   LYMPHSABS 2.1 03/09/2021 1312   MONOABS 0.3 03/09/2021 1312   EOSABS 0.3 03/09/2021 1312   BASOSABS 0.0  03/09/2021 1312    CMP     Component Value Date/Time   NA 141 03/09/2021 1326   K 4.0 03/09/2021 1326   CL 108 03/09/2021 1326   CO2 24 03/09/2021 1312   GLUCOSE 95 03/09/2021 1326   BUN 17 03/09/2021 1326   CREATININE 0.60 03/09/2021 1326   CALCIUM 9.6 03/09/2021 1312   PROT 7.7 03/09/2021 1312   ALBUMIN 4.0 03/09/2021 1312   AST 25 03/09/2021 1312   ALT 33 03/09/2021 1312   ALKPHOS 68 03/09/2021 1312   BILITOT 0.8 03/09/2021 1312   GFRNONAA >60 03/09/2021 1312   GFRAA >60 11/18/2019 0312    Lipid Panel     Component Value Date/Time   CHOL 222 (H) 11/17/2019 1615   TRIG 96 11/17/2019 1615   HDL 50 11/17/2019 1615   CHOLHDL 4.4 11/17/2019 1615   VLDL 19  11/17/2019 1615   LDLCALC 153 (H) 11/17/2019 1615     Imaging I have reviewed the images obtained:  CT-scan of the brain- There is no acute intracranial hemorrhage or evidence of acute infarction. ASPECT score is 10. CTA Head and Neck- No LVO, hemodynamically significant stenosis, or dissection CTP- no evidence of core infarction or penumbra MRI examination of the brain- Normal appearance of the brain with no acute intracranial pathology  Assessment: 56 year old female presenting with numbness, weakness, and tingling on the right side of her body. MRI pending. CT and CTA show no evidence of acute intracranial abnormality. No drift noted on exam. She does have decreased grip strength in the right hand and she had some slight weakness in her right leg. She denies palpitations, cardiac issues, syncopal episodes, or shortness of breath.   Impression:TIA vs Complex Migraine  Recommendations: -MRI brain without contrast-if negative, no further work-up. - Continue ASA 81mg  daily and statin  - Ha1C goal less than 7.0 - Lipid panel LDL less than 70 - UDS and urinalysis - Medication for migraine - Risk factor modification - Follow up with PCP   Plan discussed with Dr. Karle Starch   Attending addendum Patient seen and  examined as acute code stroke. Sensory deficit on the right side in the setting of headache-likely complex migraine. MRI negative for acute stroke No further inpatient neurological work-up Follow with outpatient neurology.   -- Amie Portland, MD Neurologist [facility] Pager: [phone]

## 2021-03-09 NOTE — ED Notes (Signed)
Pt verbalizes understanding of discharge instructions. Opportunity for questions and answers were provided. Pt discharged from the ED.   ?

## 2021-03-09 NOTE — Code Documentation (Signed)
Stroke Response Nurse Documentation Code Documentation  Michelle Yang is a 57 y.o. female arriving to [facility] ED via Private Vehicle on 03/09/2021 with past medical hx significant of stroke, hypertension, hyperlipidemia and occasional migraines. On aspirin 81 mg daily. Code stroke was activated by ED.   Patient noted numbness on the right side of her body Monday. It improved but today at work she noted the same numbness as well as dizziness and nausea.   Stroke team at the bedside on patient arrival. Labs drawn and patient cleared for CT by Dr. Karle Starch. Patient to CT with team. NIHSS 1, see documentation for details and code stroke times. Patient with right decreased sensation on exam. The following imaging was completed: CT, CTA head and neck. Patient is not a candidate for IV Thrombolytic due to out of window. Patient is not a candidate for IR due to no LVO. Code stroke canceled.   Bedside handoff with ED RN Michelle Yang.    Michelle Yang Stroke Response RN

## 2021-03-09 NOTE — ED Notes (Signed)
Taken to MRI by transporter.  °

## 2021-04-07 ENCOUNTER — Encounter (HOSPITAL_COMMUNITY): Payer: Self-pay | Admitting: Radiology

## 2021-04-12 ENCOUNTER — Encounter (HOSPITAL_COMMUNITY): Payer: Self-pay | Admitting: Emergency Medicine

## 2021-04-12 ENCOUNTER — Other Ambulatory Visit: Payer: Self-pay

## 2021-04-12 ENCOUNTER — Ambulatory Visit (HOSPITAL_COMMUNITY)
Admission: EM | Admit: 2021-04-12 | Discharge: 2021-04-12 | Disposition: A | Discharge status: Home / Self Care | Payer: BC Managed Care – PPO | Attending: Urgent Care | Admitting: Urgent Care

## 2021-04-12 DIAGNOSIS — K047 Periapical abscess without sinus: Secondary | ICD-10-CM | POA: Diagnosis not present

## 2021-04-12 DIAGNOSIS — K0889 Other specified disorders of teeth and supporting structures: Secondary | ICD-10-CM | POA: Diagnosis not present

## 2021-04-12 MED ORDER — AMOXICILLIN-POT CLAVULANATE 875-125 MG PO TABS: 1.0000 | ORAL_TABLET | Freq: Two times a day (BID) | ORAL | 0 refills | Status: DC

## 2021-04-12 MED ORDER — ACETAMINOPHEN 325 MG PO TABS
975.0000 mg | ORAL_TABLET | Freq: Once | ORAL | Status: AC
  Administered 2021-04-12: 975 mg via ORAL

## 2021-04-12 MED ORDER — TRAMADOL HCL 50 MG PO TABS: 50.0000 mg | ORAL_TABLET | Freq: Four times a day (QID) | ORAL | 0 refills | Status: DC | PRN

## 2021-04-12 MED ORDER — ACETAMINOPHEN 325 MG PO TABS
ORAL_TABLET | ORAL | Status: AC
  Filled 2021-04-12: qty 3

## 2021-04-12 NOTE — Discharge Instructions (Addendum)
Please schedule Tylenol at 500 mg - 650 mg once every 6 hours as needed for aches and pains.  If you still have pain despite taking Tylenol regularly, this is breakthrough pain.  You can use tramadol once every 6 hours for this.  Once your pain is better controlled, switch back to just Tylenol.  Make sure you schedule an appointment with a dentist/dental surgeon as soon as possible.  You may try some of the resources below.    [facility] in [place], [place] Address: [address], [place], [place] [address] Phone: [phone]  [place] [phone] extension [phone] [address].  Dr. Donn Pierini [phone] [place] [phone] [address] [facility].  Rescue mission [phone] extension [phone] [address], [place], [place], [address] First come first serve for the first 10 clients.  May do simple extractions only, no wisdom teeth or surgery.  You may try the second for Thursday of the month starting at [employer] You may call the school to see if they are still helping to provide dental care for emergent cases.

## 2021-04-12 NOTE — ED Triage Notes (Signed)
Pt reports dental pain that started yesterday and today woke up with facial swelling.

## 2021-04-12 NOTE — ED Provider Notes (Signed)
[facility]   MRN: [id number] DOB: 10/31/1965  Subjective:   Michelle Yang is a 56 y.o. female presenting for 1 day history of acute onset recurrent left sided facial pain, swelling and dental pain.  Patient states that she has to have dental work done but has not had insurance or money to get this taken care of.  Denies difficulty controlling secretions, inability to drink fluids or eat foods.  No fever, chest pain, shortness of breath, nausea, vomiting.  No current facility-administered medications for this encounter.  Current Outpatient Medications:    amLODipine (NORVASC) 5 MG tablet, Take 1 tablet (5 mg total) by mouth daily. (Patient not taking: Reported on 07/02/2020), Disp: 60 tablet, Rfl: 0   diphenhydrAMINE HCl (BENADRYL ALLERGY PO), Take 1 tablet by mouth every 5 (five) hours as needed (rash)., Disp: , Rfl:    famotidine (PEPCID) 20 MG tablet, Take 1 tablet (20 mg total) by mouth 2 (two) times daily., Disp: 60 tablet, Rfl: 0   ibuprofen (ADVIL) 600 MG tablet, Take 1 tablet (600 mg total) by mouth every 6 (six) hours as needed., Disp: 30 tablet, Rfl: 0   loratadine (CLARITIN) 10 MG tablet, Take 1 tablet (10 mg total) by mouth daily., Disp: 30 tablet, Rfl: 0   methocarbamol (ROBAXIN) 500 MG tablet, Take 1 tablet (500 mg total) by mouth 2 (two) times daily., Disp: 20 tablet, Rfl: 0   ondansetron (ZOFRAN) 4 MG tablet, Take 1 tablet (4 mg total) by mouth every 8 (eight) hours as needed for nausea or vomiting., Disp: 20 tablet, Rfl: 0   permethrin (ELIMITE) 5 % cream, Apply head to toe, leave on for 8 to 10 hours and rinse off in morning (Patient not taking: Reported on 07/02/2020), Disp: 60 g, Rfl: 0   triamcinolone (KENALOG) 0.025 % cream, Apply 1 application topically 2 (two) times daily as needed (Apply to affected areas twice daily as needed)., Disp: 454 g, Rfl: 0   No Known Allergies  Past Medical History:  Diagnosis Date   Medical history  non-contributory    Stroke [facility])      Past Surgical History:  Procedure Laterality Date   ANKLE FRACTURE SURGERY Left     Family History  Problem Relation Age of Onset   Diabetes Father     Social History   Tobacco Use   Smoking status: Never   Smokeless tobacco: Never  Substance Use Topics   Alcohol use: No   Drug use: No    ROS   Objective:   Vitals: BP (!) 172/131 (BP Location: Right Arm)    Pulse 92    Temp 98.4 F (36.9 C) (Oral)    Resp 18    Ht 5\' 6"  (1.676 m)    Wt 205 lb 14.6 oz (93.4 kg)    LMP 01/13/2017 Comment: saturday   SpO2 97%    BMI 33.23 kg/m   BP Readings from Last 3 Encounters:  04/12/21 (!) 172/131  03/09/21 (!) 158/96  01/12/21 (!) 143/102   Physical Exam Constitutional:      General: She is not in acute distress.    Appearance: Normal appearance. She is well-developed. She is not ill-appearing, toxic-appearing or diaphoretic.  HENT:     Head: Normocephalic and atraumatic.      Nose: Nose normal.     Mouth/Throat:     Mouth: Mucous membranes are moist.   Eyes:     General: No scleral icterus.  Right eye: No discharge.        Left eye: No discharge.     Extraocular Movements: Extraocular movements intact.  Cardiovascular:     Rate and Rhythm: Normal rate.  Pulmonary:     Effort: Pulmonary effort is normal.  Skin:    General: Skin is warm and dry.  Neurological:     General: No focal deficit present.     Mental Status: She is alert and oriented to person, place, and time.  Psychiatric:        Mood and Affect: Mood normal.        Behavior: Behavior normal.        Thought Content: Thought content normal.        Judgment: Judgment normal.    Assessment and Plan :   I have reviewed the PDMP during this encounter.  1. Dental abscess   2. Dental infection   3. Pain, dental    Start Augmentin for dental infection/abscess, use Tylenol for pain on a regular basis and tramadol for breakthrough pain.  Monitor blood pressure  and follow-up with PCP. Emphasized need for dental surgeon consult. Counseled patient on potential for adverse effects with medications prescribed/recommended today, strict ER and return-to-clinic precautions discussed, patient verbalized understanding.    Wallis Bamberg, PA-C 04/12/21 1521

## 2021-05-23 ENCOUNTER — Ambulatory Visit
Admission: EM | Admit: 2021-05-23 | Discharge: 2021-05-23 | Disposition: A | Discharge status: Home / Self Care | Payer: BC Managed Care – PPO | Attending: Physician Assistant | Admitting: Physician Assistant

## 2021-05-23 DIAGNOSIS — L245 Irritant contact dermatitis due to other chemical products: Secondary | ICD-10-CM

## 2021-05-23 MED ORDER — PREDNISONE 20 MG PO TABS: 40.0000 mg | ORAL_TABLET | Freq: Every day | ORAL | 0 refills | Status: DC

## 2021-05-23 NOTE — ED Provider Notes (Signed)
?[name] ? ? ? ?CSN: [id number] ?Arrival date & time: 05/23/21  0801 ? ? ?  ? ?History   ?Chief Complaint ?Chief Complaint  ?Patient presents with  ? Allergic Reaction  ? ? ?HPI ?Michelle Yang is a 56 y.o. female.  ? ?Patient here today for evaluation of itchy rash to her scalp that started after she used a different hair dye a few days ago. She reports rash is somewhat improved today. She has not had any trouble swallowing or any oral swelling. She denies any other symptoms. She tried using claritin with mild relief.  ? ?The history is provided by the patient.  ? ?Past Medical History:  ?Diagnosis Date  ? Medical history non-contributory   ? Stroke [facility](HCC)   ? ? ?Patient Active Problem List  ? Diagnosis Date Noted  ? TIA (transient ischemic attack) 11/17/2019  ? Weakness of right side of body 11/17/2019  ? Elevated blood pressure reading without diagnosis of hypertension 11/17/2019  ? Abnormal serum creatinine level 11/17/2019  ? Elevated glucose 11/17/2019  ? 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 ? ?Past Surgical History:  ?Procedure Laterality Date  ? ANKLE FRACTURE SURGERY Left   ? ? ?OB History   ? ? Gravida  ?3  ? Para  ?3  ? Term  ?3  ? Preterm  ?   ? AB  ?   ? Living  ?3  ?  ? ? SAB  ?   ? IAB  ?   ? Ectopic  ?   ? Multiple  ?   ? Live Births  ?3  ?   ?  ?  ? ? ? ?Home Medications   ? ?Prior to Admission medications   ?Medication Sig Start Date End Date Taking? Authorizing Provider  ?predniSONE (DELTASONE) 20 MG tablet Take 2 tablets (40 mg total) by mouth daily with breakfast for 5 days. 05/23/21 05/28/21 Yes Tomi BambergerMyers, Armaan Pond F, PA-C  ?amLODipine (NORVASC) 5 MG tablet Take 1 tablet (5 mg total) by mouth daily. ?Patient not taking: Reported on 07/02/2020 03/16/20   Patterson HammersmithWieters, Hallie C, PA-C  ?amoxicillin-clavulanate (AUGMENTIN) 875-125 MG tablet Take 1 tablet by mouth 2 (two) times daily. 04/12/21   Wallis BambergMani, Mario, PA-C  ?diphenhydrAMINE HCl (BENADRYL ALLERGY  PO) Take 1 tablet by mouth every 5 (five) hours as needed (rash).    [provider]  ?famotidine (PEPCID) 20 MG tablet Take 1 tablet (20 mg total) by mouth 2 (two) times daily. 07/03/20   Bing NeighborsHarris, Kimberly S, FNP  ?ibuprofen (ADVIL) 600 MG tablet Take 1 tablet (600 mg total) by mouth every 6 (six) hours as needed. 03/09/21   Pollyann SavoySheldon, Charles B, MD  ?loratadine (CLARITIN) 10 MG tablet Take 1 tablet (10 mg total) by mouth daily. 07/02/20 08/01/20  Roylene ReasonJohnson, Matthew, MD  ?methocarbamol (ROBAXIN) 500 MG tablet Take 1 tablet (500 mg total) by mouth 2 (two) times daily. 03/09/21   Pollyann SavoySheldon, Charles B, MD  ?ondansetron (ZOFRAN) 4 MG tablet Take 1 tablet (4 mg total) by mouth every 8 (eight) hours as needed for nausea or vomiting. 01/12/21   Cathlyn ParsonsKabbe, Angela M, NP  ?permethrin (ELIMITE) 5 % cream Apply head to toe, leave on for 8 to 10 hours and rinse off in morning ?Patient not taking: Reported on 07/02/2020 06/30/20   Wieters, Fran LowesHallie C, PA-C  ?traMADol (ULTRAM) 50 MG tablet Take 1 tablet (50 mg total) by mouth every 6 (six) hours as needed. 04/12/21  Wallis Bamberg, PA-C  ?triamcinolone (KENALOG) 0.025 % cream Apply 1 application topically 2 (two) times daily as needed (Apply to affected areas twice daily as needed). 07/03/20   Bing Neighbors, FNP  ?atorvastatin (LIPITOR) 80 MG tablet Take 1 tablet (80 mg total) by mouth daily. 11/19/19 03/16/20  Joseph Art, DO  ? ? ?Family History ?Family History  ?Problem Relation Age of Onset  ? Diabetes Father   ? ? ?Social History ?Social History  ? ?Tobacco Use  ? Smoking status: Never  ? Smokeless tobacco: Never  ?Substance Use Topics  ? Alcohol use: No  ? Drug use: No  ? ? ? ?Allergies   ?Patient has no known allergies. ? ? ?Review of Systems ?Review of Systems  ?Constitutional:  Negative for chills and fever.  ?HENT:  Negative for facial swelling and trouble swallowing.   ?Eyes:  Negative for discharge and redness.  ?Respiratory:  Negative for shortness of breath.    ?Gastrointestinal:  Negative for abdominal pain, nausea and vomiting.  ? ? ?Physical Exam ?Triage Vital Signs ?ED Triage Vitals  ?Enc Vitals Group  ?   BP   ?   Pulse   ?   Resp   ?   Temp   ?   Temp src   ?   SpO2   ?   Weight   ?   Height   ?   Head Circumference   ?   Peak Flow   ?   Pain Score   ?   Pain Loc   ?   Pain Edu?   ?   Excl. in GC?   ? ?No data found. ? ?Updated Vital Signs ?BP (!) 139/97 (BP Location: Right Arm)   Pulse 98   Temp 98 ?F (36.7 ?C) (Oral)   Resp 15   Ht 5\' 4"  (1.626 m)   Wt 199 lb (90.3 kg)   LMP 01/13/2017 Comment: saturday  SpO2 97%   BMI 34.16 kg/m?  ?   ? ?Physical Exam ?Vitals and nursing note reviewed.  ?Constitutional:   ?   General: She is not in acute distress. ?   Appearance: Normal appearance. She is not ill-appearing.  ?HENT:  ?   Head: Normocephalic and atraumatic.  ?Eyes:  ?   Conjunctiva/sclera: Conjunctivae normal.  ?Cardiovascular:  ?   Rate and Rhythm: Normal rate.  ?Pulmonary:  ?   Effort: Pulmonary effort is normal.  ?Skin: ?   Findings: Rash (papular rash scattered to scalp) present.  ?Neurological:  ?   Mental Status: She is alert.  ?Psychiatric:     ?   Mood and Affect: Mood normal.     ?   Behavior: Behavior normal.     ?   Thought Content: Thought content normal.  ? ? ? ?UC Treatments / Results  ?Labs ?(all labs ordered are listed, but only abnormal results are displayed) ?Labs Reviewed - No data to display ? ?EKG ? ? ?Radiology ?No results found. ? ?Procedures ?Procedures (including critical care time) ? ?Medications Ordered in UC ?Medications - No data to display ? ?Initial Impression / Assessment and Plan / UC Course  ?I have reviewed the triage vital signs and the nursing notes. ? ?Pertinent labs & imaging results that were available during my care of the patient were reviewed by me and considered in my medical decision making (see chart for details). ? ?  ?Prednisone prescribed to hopefully alleviate symptoms and improve reaction. Encouraged follow  up with any further concerns including any signs and infection.  ? ?Final Clinical Impressions(s) / UC Diagnoses  ? ?Final diagnoses:  ?Irritant contact dermatitis due to other chemical products  ? ?Discharge Instructions   ?None ?  ? ?ED Prescriptions   ? ? Medication Sig Dispense Auth. Provider  ? predniSONE (DELTASONE) 20 MG tablet Take 2 tablets (40 mg total) by mouth daily with breakfast for 5 days. 10 tablet Tomi Bamberger, PA-C  ? ?  ? ?PDMP not reviewed this encounter. ?  ?Tomi Bamberger, PA-C ?05/23/21 0848 ? ?

## 2021-05-23 NOTE — ED Triage Notes (Signed)
Pt presents with macular itchy rash on scalp pt st she died her hair on 06/27/22 with perm hair dye which she has never used. Pt reports not washing hair since. But has taken Claritin.  ?

## 2021-05-25 ENCOUNTER — Ambulatory Visit
Admission: EM | Admit: 2021-05-25 | Discharge: 2021-05-25 | Disposition: A | Discharge status: Home / Self Care | Payer: BC Managed Care – PPO | Attending: Internal Medicine | Admitting: Internal Medicine

## 2021-05-25 DIAGNOSIS — L243 Irritant contact dermatitis due to cosmetics: Secondary | ICD-10-CM | POA: Diagnosis not present

## 2021-05-25 MED ORDER — HYDROXYZINE HCL 25 MG PO TABS: 25.0000 mg | ORAL_TABLET | Freq: Four times a day (QID) | ORAL | 0 refills | Status: DC | PRN

## 2021-05-25 MED ORDER — PREDNISONE 10 MG (21) PO TBPK: ORAL_TABLET | Freq: Every day | ORAL | 0 refills | Status: DC

## 2021-05-25 NOTE — ED Triage Notes (Signed)
Pt presents with c/o an allergic reaction. Pt states she put in a permanent dye for her hair and states when she put in the dye she started to have a rash all around her face. Pt states she now has facial swelling. States she was taking prednisone and reports it is not working.  ?

## 2021-05-25 NOTE — Discharge Instructions (Signed)
Stop previously prescribed prednisone.  Start prednisone that was prescribed today.  Hydroxyzine has also been prescribed for you to take as needed for itching.  Please be advised that this can cause drowsiness.  Follow-up if symptoms persist or worsen. ?

## 2021-05-25 NOTE — ED Provider Notes (Signed)
?[name] ? ? ? ?CSN: [id number] ?Arrival date & time: 05/25/21  0805 ? ? ?  ? ?History   ?Chief Complaint ?Chief Complaint  ?Patient presents with  ? Allergic Reaction  ? ? ?HPI ?Michelle Yang is a 56 y.o. female.  ? ?Patient presents with allergic reaction from a new hair dye that started a few days prior.  Patient was seen on 05/23/2021 and was prescribed 5-day course of 40 mg prednisone that patient reports has not provided any improvement.  Patient reports itchy rash to scalp that is starting to extend down into face with associated eyelid swelling and facial swelling.  Patient denies any shortness of breath or feelings of throat closing.  She has also taken Benadryl with no improvement. Denies fever.  ? ? ?Allergic Reaction ? ?Past Medical History:  ?Diagnosis Date  ? Medical history non-contributory   ? Stroke [facility])   ? ? ?Patient Active Problem List  ? Diagnosis Date Noted  ? TIA (transient ischemic attack) 11/17/2019  ? Weakness of right side of body 11/17/2019  ? Elevated blood pressure reading without diagnosis of hypertension 11/17/2019  ? Abnormal serum creatinine level 11/17/2019  ? Elevated glucose 11/17/2019  ? 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 ? ?Past Surgical History:  ?Procedure Laterality Date  ? ANKLE FRACTURE SURGERY Left   ? ? ?OB History   ? ? Gravida  ?3  ? Para  ?3  ? Term  ?3  ? Preterm  ?   ? AB  ?   ? Living  ?3  ?  ? ? SAB  ?   ? IAB  ?   ? Ectopic  ?   ? Multiple  ?   ? Live Births  ?3  ?   ?  ?  ? ? ? ?Home Medications   ? ?Prior to Admission medications   ?Medication Sig Start Date End Date Taking? Authorizing Provider  ?hydrOXYzine (ATARAX) 25 MG tablet Take 1 tablet (25 mg total) by mouth every 6 (six) hours as needed for itching. 05/25/21  Yes Gustavus Bryant, FNP  ?predniSONE (STERAPRED UNI-PAK 21 TAB) 10 MG (21) TBPK tablet Take by mouth daily. Take 6 tabs by mouth daily  for 2 days, then 5 tabs for 2 days, then 4  tabs for 2 days, then 3 tabs for 2 days, 2 tabs for 2 days, then 1 tab by mouth daily for 2 days 05/25/21  Yes Ardene Remley, Rolly Salter E, FNP  ?amLODipine (NORVASC) 5 MG tablet Take 1 tablet (5 mg total) by mouth daily. ?Patient not taking: Reported on 07/02/2020 03/16/20   Patterson Hammersmith C, PA-C  ?amoxicillin-clavulanate (AUGMENTIN) 875-125 MG tablet Take 1 tablet by mouth 2 (two) times daily. 04/12/21   Wallis Bamberg, PA-C  ?diphenhydrAMINE HCl (BENADRYL ALLERGY PO) Take 1 tablet by mouth every 5 (five) hours as needed (rash).    [provider]  ?famotidine (PEPCID) 20 MG tablet Take 1 tablet (20 mg total) by mouth 2 (two) times daily. 07/03/20   Bing Neighbors, FNP  ?ibuprofen (ADVIL) 600 MG tablet Take 1 tablet (600 mg total) by mouth every 6 (six) hours as needed. 03/09/21   Pollyann Savoy, MD  ?loratadine (CLARITIN) 10 MG tablet Take 1 tablet (10 mg total) by mouth daily. 07/02/20 08/01/20  Roylene Reason, MD  ?methocarbamol (ROBAXIN) 500 MG tablet Take 1 tablet (500 mg total) by mouth 2 (two) times daily. 03/09/21  Pollyann SavoySheldon, Charles B, MD  ?ondansetron (ZOFRAN) 4 MG tablet Take 1 tablet (4 mg total) by mouth every 8 (eight) hours as needed for nausea or vomiting. 01/12/21   Cathlyn ParsonsKabbe, Angela M, NP  ?permethrin (ELIMITE) 5 % cream Apply head to toe, leave on for 8 to 10 hours and rinse off in morning ?Patient not taking: Reported on 07/02/2020 06/30/20   Wieters, Fran LowesHallie C, PA-C  ?traMADol (ULTRAM) 50 MG tablet Take 1 tablet (50 mg total) by mouth every 6 (six) hours as needed. 04/12/21   Wallis BambergMani, Mario, PA-C  ?triamcinolone (KENALOG) 0.025 % cream Apply 1 application topically 2 (two) times daily as needed (Apply to affected areas twice daily as needed). 07/03/20   Bing NeighborsHarris, Kimberly S, FNP  ?atorvastatin (LIPITOR) 80 MG tablet Take 1 tablet (80 mg total) by mouth daily. 11/19/19 03/16/20  Joseph ArtVann, Jessica U, DO  ? ? ?Family History ?Family History  ?Problem Relation Age of Onset  ? Diabetes Father   ? ? ?Social History ?Social  History  ? ?Tobacco Use  ? Smoking status: Never  ? Smokeless tobacco: Never  ?Substance Use Topics  ? Alcohol use: No  ? Drug use: No  ? ? ? ?Allergies   ?Patient has no known allergies. ? ? ?Review of Systems ?Review of Systems ?Per HPI ? ?Physical Exam ?Triage Vital Signs ?ED Triage Vitals  ?Enc Vitals Group  ?   BP 05/25/21 0832 (!) 173/103  ?   Pulse Rate 05/25/21 0832 89  ?   Resp --   ?   Temp 05/25/21 0832 98 ?F (36.7 ?C)  ?   Temp Source 05/25/21 0832 Oral  ?   SpO2 05/25/21 0832 98 %  ?   Weight --   ?   Height --   ?   Head Circumference --   ?   Peak Flow --   ?   Pain Score 05/25/21 0831 0  ?   Pain Loc --   ?   Pain Edu? --   ?   Excl. in GC? --   ? ?No data found. ? ?Updated Vital Signs ?BP (!) 148/103   Pulse 89   Temp 98 ?F (36.7 ?C) (Oral)   LMP 01/13/2017 Comment: saturday  SpO2 98%  ? ?Visual Acuity ?Right Eye Distance:   ?Left Eye Distance:   ?Bilateral Distance:   ? ?Right Eye Near:   ?Left Eye Near:    ?Bilateral Near:    ? ?Physical Exam ?Constitutional:   ?   General: She is not in acute distress. ?   Appearance: Normal appearance. She is not toxic-appearing or diaphoretic.  ?HENT:  ?   Head: Normocephalic and atraumatic.  ?   Mouth/Throat:  ?   Pharynx: No pharyngeal swelling.  ?Eyes:  ?   Extraocular Movements: Extraocular movements intact.  ?   Conjunctiva/sclera: Conjunctivae normal.  ?Cardiovascular:  ?   Rate and Rhythm: Normal rate and regular rhythm.  ?   Pulses: Normal pulses.  ?   Heart sounds: Normal heart sounds.  ?Pulmonary:  ?   Effort: Pulmonary effort is normal. No respiratory distress.  ?   Breath sounds: Normal breath sounds.  ?Skin: ?   Findings: Rash present.  ?   Comments: Diffuse macular papular rash present throughout scalp that is descending down into sides of face.  Mild eyelid swelling.  No rash to face but there is mild swelling surrounding lips and cheeks of face.  No abnormality to throat.  No signs of bacterial infection.  ?Neurological:  ?   General: No  focal deficit present.  ?   Mental Status: She is alert and oriented to person, place, and time. Mental status is at baseline.  ?   Cranial Nerves: Cranial nerves 2-12 are intact.  ?   Sensory: Sensation is intact.  ?   Motor: Motor function is intact.  ?   Coordination: Coordination is intact.  ?   Gait: Gait is intact.  ?Psychiatric:     ?   Mood and Affect: Mood normal.     ?   Behavior: Behavior normal.     ?   Thought Content: Thought content normal.     ?   Judgment: Judgment normal.  ? ? ? ?UC Treatments / Results  ?Labs ?(all labs ordered are listed, but only abnormal results are displayed) ?Labs Reviewed - No data to display ? ?EKG ? ? ?Radiology ?No results found. ? ?Procedures ?Procedures (including critical care time) ? ?Medications Ordered in UC ?Medications - No data to display ? ?Initial Impression / Assessment and Plan / UC Course  ?I have reviewed the triage vital signs and the nursing notes. ? ?Pertinent labs & imaging results that were available during my care of the patient were reviewed by me and considered in my medical decision making (see chart for details). ? ?  ? ?Allergic reaction appears to be getting worse per patient report and per previous physical exam documentation from previous visit.  I do think the patient would benefit from a higher dose of steroids.  Patient to stop previously prescribed prednisone and to start higher dose of steroid taper.  Hydroxyzine also prescribed for patient as well.  Patient advised to not take any other antihistamines while taking this medication.  Advised patient this medication can cause drowsiness.  No signs of respiratory distress on exam at this time.  Patient's blood pressure is slightly elevated but suspect that prednisone could be contributing to this.  Patient to monitor blood pressure at home and to follow-up with PCP or urgent care if it remains elevated.  No signs of hypertensive urgency on exam.  Blood pressure recheck was lower.  Discussed  strict return and ER precautions.  Patient verbalized understanding and was agreeable with plan. ?Final Clinical Impressions(s) / UC Diagnoses  ? ?Final diagnoses:  ?Irritant contact dermatitis due to cosmetics

## 2021-06-30 DIAGNOSIS — N8111 Cystocele, midline: Secondary | ICD-10-CM | POA: Insufficient documentation

## 2021-06-30 DIAGNOSIS — N814 Uterovaginal prolapse, unspecified: Secondary | ICD-10-CM | POA: Insufficient documentation

## 2021-06-30 DIAGNOSIS — N816 Rectocele: Secondary | ICD-10-CM | POA: Insufficient documentation

## 2021-08-19 DIAGNOSIS — E782 Mixed hyperlipidemia: Secondary | ICD-10-CM | POA: Insufficient documentation

## 2021-08-19 DIAGNOSIS — I10 Essential (primary) hypertension: Secondary | ICD-10-CM | POA: Insufficient documentation

## 2021-08-19 DIAGNOSIS — E559 Vitamin D deficiency, unspecified: Secondary | ICD-10-CM | POA: Insufficient documentation

## 2021-11-20 ENCOUNTER — Ambulatory Visit
Admission: EM | Admit: 2021-11-20 | Discharge: 2021-11-20 | Disposition: A | Discharge status: Home / Self Care | Payer: BC Managed Care – PPO | Attending: Internal Medicine | Admitting: Internal Medicine

## 2021-11-20 DIAGNOSIS — K047 Periapical abscess without sinus: Secondary | ICD-10-CM | POA: Diagnosis not present

## 2021-11-20 DIAGNOSIS — K0889 Other specified disorders of teeth and supporting structures: Secondary | ICD-10-CM | POA: Diagnosis not present

## 2021-11-20 MED ORDER — TRAMADOL HCL 50 MG PO TABS: 50.0000 mg | ORAL_TABLET | Freq: Four times a day (QID) | ORAL | 0 refills | Status: DC | PRN

## 2021-11-20 MED ORDER — AMOXICILLIN-POT CLAVULANATE 875-125 MG PO TABS: 1.0000 | ORAL_TABLET | Freq: Two times a day (BID) | ORAL | 0 refills | Status: DC

## 2021-11-20 NOTE — ED Triage Notes (Signed)
Pt c/o dental pain since ~ Friday.

## 2021-11-20 NOTE — ED Provider Notes (Addendum)
[name]    CSN: [id number] Arrival date & time: 11/20/21  0949      History   Chief Complaint Chief Complaint  Patient presents with   Dental Pain    HPI Michelle Yang is a 56 y.o. female.   Patient presents with left upper dental pain that started 2 days ago.  Denies any trauma to the mouth.  Denies any associated fever.  Patient reports history of dental infection.  Has taken Tylenol with minimal improvement.  Reports that she does not have a dentist given insurance complications.  Patient has elevated blood pressure reading as well but reports that she has not taken her blood pressure medication today.  Denies chest pain, shortness of breath, headache, dizziness, blurred vision, nausea, vomiting.   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 Authorizing Provider  amoxicillin-clavulanate (AUGMENTIN) 875-125 MG tablet Take 1 tablet by mouth every 12 (twelve) hours. 11/20/21  Yes Inis Borneman, Michele Rockers, FNP  traMADol (ULTRAM) 50 MG tablet Take 1 tablet (50 mg total) by mouth every 6 (six) hours as needed for severe pain. 11/20/21  Yes Berline Semrad, Hildred Alamin E, FNP   amLODipine (NORVASC) 5 MG tablet Take 1 tablet (5 mg total) by mouth daily. Patient not taking: Reported on 07/02/2020 03/16/20   Wieters, Madelynn Done C, PA-C  diphenhydrAMINE HCl (BENADRYL ALLERGY PO) Take 1 tablet by mouth every 5 (five) hours as needed (rash).    [provider]  famotidine (PEPCID) 20 MG tablet Take 1 tablet (20 mg total) by mouth 2 (two) times daily. 07/03/20   Scot Jun, FNP  hydrOXYzine (ATARAX) 25 MG tablet Take 1 tablet (25 mg total) by mouth every 6 (six) hours as needed for itching. 05/25/21   Teodora Medici, FNP  ibuprofen (ADVIL) 600 MG tablet Take 1 tablet (600 mg total) by mouth every 6 (six) hours as needed. 03/09/21   Truddie Hidden, MD  loratadine (CLARITIN) 10 MG tablet Take 1 tablet (10 mg total) by mouth daily. 07/02/20 08/01/20  Cato Mulligan, MD  methocarbamol (ROBAXIN) 500 MG tablet Take 1 tablet (500 mg total) by mouth 2 (two) times daily. 03/09/21   Truddie Hidden, MD  ondansetron (ZOFRAN) 4 MG tablet Take 1 tablet (4 mg total) by mouth every 8 (eight) hours as needed for nausea or vomiting. 01/12/21   Willeen Cass  M, NP  permethrin (ELIMITE) 5 % cream Apply head to toe, leave on for 8 to 10 hours and rinse off in morning Patient not taking: Reported on 07/02/2020 06/30/20   Wieters, Hallie C, PA-C  predniSONE (STERAPRED UNI-PAK 21 TAB) 10 MG (21) TBPK tablet Take by mouth daily. Take 6 tabs by mouth daily  for 2 days, then 5 tabs for 2 days, then 4 tabs for 2 days, then 3 tabs for 2 days, 2 tabs for 2 days, then 1 tab by mouth daily for 2 days 05/25/21   Teodora Medici, FNP  triamcinolone (KENALOG) 0.025 % cream Apply 1 application topically 2 (two) times daily as needed (Apply to affected areas twice daily as needed). 07/03/20   Scot Jun, FNP  atorvastatin (LIPITOR) 80 MG tablet Take 1 tablet (80 mg total) by mouth daily. 11/19/19 03/16/20  Geradine Girt, DO    Family History Family History  Problem Relation Age of Onset    Diabetes Father     Social History Social History   Tobacco Use   Smoking status: Never   Smokeless tobacco: Never  Substance Use Topics   Alcohol use: No   Drug use: No     Allergies   Patient has no known allergies.   Review of Systems Review of Systems Per HPI  Physical Exam Triage Vital Signs ED Triage Vitals [11/20/21 1048]  Enc Vitals Group     BP (!) 169/108     Pulse Rate 99     Resp 16     Temp 98 F (36.7 C)     Temp Source Oral     SpO2 97 %     Weight      Height      Head Circumference      Peak Flow      Pain Score 10     Pain Loc      Pain Edu?      Excl. in [place]?    No data found.  Updated Vital Signs BP (!) 169/108 (BP Location: Left Arm)   Pulse 99   Temp 98 F (36.7 C) (Oral)   Resp 16   LMP 01/13/2017 Comment: saturday  SpO2 97%   Visual Acuity Right Eye Distance:   Left Eye Distance:   Bilateral Distance:    Right Eye Near:   Left Eye Near:    Bilateral Near:     Physical Exam Constitutional:      General: She is not in acute distress.    Appearance: Normal appearance. She is not toxic-appearing or diaphoretic.  HENT:     Head: Normocephalic and atraumatic.     Mouth/Throat:     Lips: Pink.     Mouth: Mucous membranes are moist.     Dentition: Dental tenderness and gingival swelling present.      Comments: Patient has left upper gingival swelling and erythema. Eyes:     Extraocular Movements: Extraocular movements intact.     Conjunctiva/sclera: Conjunctivae normal.     Pupils: Pupils are equal, round, and reactive to light.  Cardiovascular:     Rate and Rhythm: Normal rate and regular rhythm.     Pulses: Normal pulses.     Heart sounds: Normal heart sounds.  Pulmonary:     Effort: Pulmonary effort is normal. No respiratory distress.     Breath sounds: Normal breath sounds.  Neurological:     General: No focal deficit present.  Mental Status: She is alert and oriented to person, place, and time. Mental  status is at baseline.     Cranial Nerves: Cranial nerves 2-12 are intact.     Sensory: Sensation is intact.     Motor: Motor function is intact.     Coordination: Coordination is intact.     Gait: Gait is intact.  Psychiatric:        Mood and Affect: Mood normal.        Behavior: Behavior normal.        Thought Content: Thought content normal.        Judgment: Judgment normal.      UC Treatments / Results  Labs (all labs ordered are listed, but only abnormal results are displayed) Labs Reviewed - No data to display  EKG   Radiology No results found.  Procedures Procedures (including critical care time)  Medications Ordered in UC Medications - No data to display  Initial Impression / Assessment and Plan / UC Course  I have reviewed the triage vital signs and the nursing notes.  Pertinent labs & imaging results that were available during my care of the patient were reviewed by me and considered in my medical decision making (see chart for details).     Patient has dental infection concerns on exam so will treat with Augmentin antibiotic.  Over-the-counter pain relievers have not been helpful for patient so will prescribe a few pills of tramadol for patient to take as needed and to use sparingly.  Advised patient that this can cause drowsiness and do not drive or drink alcohol with taking this medication. PDMP reviewed.  Patient advised to follow-up with dentist for further evaluation and management and was provided with dental resources information.  Patient also has elevated blood pressure reading but reports that she has not taken her blood pressure medication today.  Patient was advised to restart blood pressure medication at previously prescribed dose and to monitor blood pressure at home.  No signs of hypertensive emergency on exam as there are no signs of endorgan damage and neuro exam is normal.  Patient was given strict return and ER precautions.  Patient verbalized  understanding and was agreeable with plan. Final Clinical Impressions(s) / UC Diagnoses   Final diagnoses:  Dental infection  Pain, dental     Discharge Instructions      You have been prescribed an antibiotic to take for dental infection.  Please follow-up with dentist for further evaluation and management.  I have also prescribed tramadol which you will take as needed for pain but please use sparingly.  This medication causes drowsiness so do not drive or drink alcohol with taking this.  Do not take any other sedating medications with this as well.    ED Prescriptions     Medication Sig Dispense Auth. Provider   amoxicillin-clavulanate (AUGMENTIN) 875-125 MG tablet Take 1 tablet by mouth every 12 (twelve) hours. 14 tablet [place], [place] E, [place]   traMADol (ULTRAM) 50 MG tablet Take 1 tablet (50 mg total) by mouth every 6 (six) hours as needed for severe pain. 15 tablet [address], [place] E, [place]      I have reviewed the PDMP during this encounter.   Teodora Medici, [place] 11/20/21 [place], [place], [place] 11/20/21 1235

## 2021-11-20 NOTE — Discharge Instructions (Signed)
You have been prescribed an antibiotic to take for dental infection.  Please follow-up with dentist for further evaluation and management.  I have also prescribed tramadol which you will take as needed for pain but please use sparingly.  This medication causes drowsiness so do not drive or drink alcohol with taking this.  Do not take any other sedating medications with this as well.

## 2021-12-08 ENCOUNTER — Emergency Department (HOSPITAL_COMMUNITY)
Admission: EM | Admit: 2021-12-08 | Discharge: 2021-12-08 | Disposition: A | Discharge status: Home / Self Care | Payer: BC Managed Care – PPO | Attending: Emergency Medicine | Admitting: Emergency Medicine

## 2021-12-08 ENCOUNTER — Emergency Department (HOSPITAL_COMMUNITY): Payer: BC Managed Care – PPO

## 2021-12-08 ENCOUNTER — Other Ambulatory Visit: Payer: Self-pay

## 2021-12-08 DIAGNOSIS — R0789 Other chest pain: Secondary | ICD-10-CM | POA: Insufficient documentation

## 2021-12-08 DIAGNOSIS — E876 Hypokalemia: Secondary | ICD-10-CM | POA: Insufficient documentation

## 2021-12-08 DIAGNOSIS — R202 Paresthesia of skin: Secondary | ICD-10-CM | POA: Insufficient documentation

## 2021-12-08 DIAGNOSIS — H538 Other visual disturbances: Secondary | ICD-10-CM | POA: Diagnosis not present

## 2021-12-08 DIAGNOSIS — R42 Dizziness and giddiness: Secondary | ICD-10-CM | POA: Diagnosis not present

## 2021-12-08 DIAGNOSIS — Z79899 Other long term (current) drug therapy: Secondary | ICD-10-CM | POA: Insufficient documentation

## 2021-12-08 LAB — BASIC METABOLIC PANEL
Anion gap: 11 (ref 5–15)
BUN: 15 mg/dL (ref 6–20)
CO2: 26 mmol/L (ref 22–32)
Calcium: 9.6 mg/dL (ref 8.9–10.3)
Chloride: 102 mmol/L (ref 98–111)
Creatinine, Ser: 0.78 mg/dL (ref 0.44–1.00)
GFR, Estimated: >60 mL/min (ref >60)
Glucose, Bld: 111 mg/dL — ABNORMAL HIGH (ref 70–99)
Potassium: 3.4 mmol/L — ABNORMAL LOW (ref 3.5–5.1)
Sodium: 139 mmol/L (ref 135–145)

## 2021-12-08 LAB — CBC
HCT: 43 % (ref 36.0–46.0)
Hemoglobin: 14.3 g/dL (ref 12.0–15.0)
MCH: 27.3 pg (ref 26.0–34.0)
MCHC: 33.3 g/dL (ref 30.0–36.0)
MCV: 82.1 fL (ref 80.0–100.0)
Platelets: 306 10*3/uL (ref 150–400)
RBC: 5.24 MIL/uL — ABNORMAL HIGH (ref 3.87–5.11)
RDW: 14 % (ref 11.5–15.5)
WBC: 5 10*3/uL (ref 4.0–10.5)
nRBC: 0 % (ref 0.0–0.2)

## 2021-12-08 LAB — I-STAT BETA HCG BLOOD, ED (MC, WL, AP ONLY): I-stat hCG, quantitative: <5 m[IU]/mL (ref <5)

## 2021-12-08 LAB — TROPONIN I (HIGH SENSITIVITY)
Troponin I (High Sensitivity): 3 ng/L (ref <18)
Troponin I (High Sensitivity): 4 ng/L (ref <18)

## 2021-12-08 MED ORDER — ACETAMINOPHEN 500 MG PO TABS
1000.0000 mg | ORAL_TABLET | ORAL | Status: AC
  Administered 2021-12-08: 1000 mg via ORAL
  Filled 2021-12-08: qty 2

## 2021-12-08 MED ORDER — LIDOCAINE 5 % EX PTCH
1.0000 | MEDICATED_PATCH | CUTANEOUS | Status: DC
  Administered 2021-12-08: 1 via TRANSDERMAL
  Filled 2021-12-08: qty 1

## 2021-12-08 NOTE — ED Triage Notes (Signed)
Pt. Stated, Michelle Yang had some numbness on the right side with tingling started about 4 days ago and seemed worse this morning. My chest is also tight. Ive had some dizziness.  Pt had bilateral =grips , no arm drift, no strength deficient  on the right side, symmetrical smile

## 2021-12-08 NOTE — Discharge Instructions (Signed)
Please follow-up with neurology and given you the information for 2 different offices.  I also recommend that you follow-up with your primary care provider.   Return to the ER for any new or concerning symptoms.

## 2021-12-08 NOTE — ED Provider Notes (Signed)
[facility] EM[facility] DEPART[facility] Provider Note   CSN: [id number] Arrival date & time: 12/08/21  [id number]     History  Chief Complaint  Patient presents with   Chest Pain   Dizziness    Michelle Yang is a 56 y.o. female.  HPI  Patient is a 56 year old female with a past medical history significant for episodes of dizziness and right upper extremity paresthesias.  She has had MRIs done twice in the past which were negative for stroke.  Patient states that she started having some paresthesias/tingling/numbness in her right upper extremity 4 days ago with progressive worsening over the last 4 days.  She states that this morning she feels that her chest became somewhat tight and she states that she feels that the room is at times wobbling or spinning.  She states she has some mild blurriness in her right eye.  She denies any slurred speech or difficulty swallowing.  She states that her symptoms have been constant but slightly worsening today.  She states she has been able to walk without difficulty and has not been limping.  She also notices that her paresthesias seem to be affecting her right leg as well  She also endorses some chest tightness which she states she noticed worsening this morning.  She states that she has not had any nausea vomiting or difficulty breathing.    Home Medications Prior to Admission medications   Medication Sig Start Date End Date Taking? Authorizing Provider  atorvastatin (LIPITOR) 20 MG tablet Take 20 mg by mouth daily. 11/28/21  Yes [provider]  hydrochlorothiazide (MICROZIDE) 12.5 MG capsule Take 12.5 mg by mouth daily. 09/05/21  Yes [provider]  ibuprofen (ADVIL) 600 MG tablet Take 1 tablet (600 mg total) by mouth every 6 (six) hours as needed. 03/09/21  Yes Truddie Hidden, MD  losartan (COZAAR) 100 MG tablet Take 100 mg by mouth daily. 11/14/21  Yes [provider]  triamcinolone (KENALOG)  0.025 % cream Apply 1 application topically 2 (two) times daily as needed (Apply to affected areas twice daily as needed). 07/03/20  Yes Scot Jun, FNP  amoxicillin-clavulanate (AUGMENTIN) 875-125 MG tablet Take 1 tablet by mouth every 12 (twelve) hours. Patient not taking: Reported on 12/08/2021 11/20/21   Teodora Medici, FNP  traMADol (ULTRAM) 50 MG tablet Take 1 tablet (50 mg total) by mouth every 6 (six) hours as needed for severe pain. Patient not taking: Reported on 12/08/2021 11/20/21   Teodora Medici, FNP      Allergies    Patient has no known allergies.    Review of Systems   Review of Systems  Physical Exam Updated Vital Signs BP 139/85   Pulse 83   Temp 97.6 F (36.4 C) (Oral)   Resp 14   Ht 5\' 4"  (1.626 m)   Wt 91.2 kg   LMP 01/13/2017 Comment: saturday  SpO2 98%   BMI 34.50 kg/m  Physical Exam Vitals and nursing note reviewed.  Constitutional:      General: She is not in acute distress. HENT:     Head: Normocephalic and atraumatic.     Nose: Nose normal.  Eyes:     General: No scleral icterus. Cardiovascular:     Rate and Rhythm: Normal rate and regular rhythm.     Pulses: Normal pulses.     Heart sounds: Normal heart sounds.  Pulmonary:     Effort: Pulmonary effort is normal. No respiratory distress.  Breath sounds: No wheezing.  Abdominal:     Palpations: Abdomen is soft.     Tenderness: There is no abdominal tenderness. There is no guarding or rebound.  Musculoskeletal:     Cervical back: Normal range of motion.     Right lower leg: No edema.     Left lower leg: No edema.     Comments: Very reproducible trigger point tenderness in right axilla/musculature of the armpit.  This seems to reproduce the patient's discomfort  Skin:    General: Skin is warm and dry.     Capillary Refill: Capillary refill takes less than 2 seconds.  Neurological:     Mental Status: She is alert. Mental status is at baseline.     Comments: Alert and oriented to  self, place, time and event.   Speech is fluent, clear without dysarthria or dysphasia.   Strength 5/5 in upper/lower extremities   Sensation intact in upper/lower extremities   Normal gait.  Negative Romberg. No pronator drift.  CN I not tested  CN II grossly intact visual fields bilaterally. Did not visualize posterior eye.  CN III, IV, VI PERRLA and EOMs intact bilaterally  CN V Intact sensation to sharp and light touch to the face  CN VII facial movements symmetric  CN VIII not tested  CN IX, X no uvula deviation, symmetric rise of soft palate  CN XI 5/5 SCM and trapezius strength bilaterally  CN XII Midline tongue protrusion, symmetric L/R movements     Psychiatric:        Mood and Affect: Mood normal.        Behavior: Behavior normal.     ED Results / Procedures / Treatments   Labs (all labs ordered are listed, but only abnormal results are displayed) Labs Reviewed  BASIC METABOLIC PANEL - Abnormal; Notable for the following components:      Result Value   Potassium 3.4 (*)    Glucose, Bld 111 (*)    All other components within normal limits  CBC - Abnormal; Notable for the following components:   RBC 5.24 (*)    All other components within normal limits  I-STAT BETA HCG BLOOD, ED ([facility], [facility], [facility] ONLY)  TROPONIN I (HIGH SENSITIVITY)  TROPONIN I (HIGH SENSITIVITY)    EKG EKG Interpretation  Date/Time:  Thursday December 08 2021 09:09:25 EDT Ventricular Rate:  117 PR Interval:  144 QRS Duration: 70 QT Interval:  334 QTC Calculation: 465 R Axis:   -9 Text Interpretation: Sinus tachycardia Minimal voltage criteria for LVH, may be normal variant ( R in aVL ) Anterior infarct , age undetermined Abnormal ECG No significant change since last tracing Confirmed by Deno Etienne [phone]) on 12/09/2021 12:14:28 PM  Radiology MR BRAIN WO CONTRAST  Result Date: 12/08/2021 CLINICAL DATA:  Neuro deficit, acute, stroke suspected R side weakness of RUE/RLE EXAM: MRI HEAD WITHOUT  CONTRAST TECHNIQUE: Multiplanar, multiecho pulse sequences of the brain and surrounding structures were obtained without intravenous contrast. COMPARISON:  MRI head 03/09/2021. FINDINGS: Brain: No acute infarction, hemorrhage, hydrocephalus, extra-axial collection or mass lesion. Vascular: Major arterial flow voids are maintained at the skull base. Skull and upper cervical spine: Normal marrow signal. Sinuses/Orbits: Clear sinuses.  No acute orbital findings. Other: No mastoid effusions. IMPRESSION: Normal brain MRI.  No acute abnormality. Electronically Signed   By: Margaretha Sheffield M.D.   On: 12/08/2021 11:32   DG Chest 2 View  Result Date: 12/08/2021 CLINICAL DATA:  Right-sided chest pain and  right arm pain starting this morning. EXAM: CHEST - 2 VIEW COMPARISON:  Chest two views 11/17/2019 FINDINGS: Cardiac silhouette and mediastinal contours are within normal limits. Mild calcification within the aortic arch. The lungs are clear. No pleural effusion or pneumothorax. No acute skeletal abnormality. IMPRESSION: No active cardiopulmonary disease. Electronically Signed   By: Yvonne Kendall M.D.   On: 12/08/2021 10:14    Procedures Procedures    Medications Ordered in ED Medications  acetaminophen (TYLENOL) tablet 1,000 mg (1,000 mg Oral Given 12/08/21 1750)    ED Course/ Medical Decision Making/ A&P                           Medical Decision Making Amount and/or Complexity of Data Reviewed Labs: ordered. Radiology: ordered.  Risk OTC drugs. Prescription drug management.   This patient presents to the ED for concern of dizziness, paresthesias, chest pain, this involves a number of treatment options, and is a complaint that carries with it a high risk of complications and morbidity. A differential diagnosis was considered for the patient's symptoms which is discussed below:   Certainly stroke, MS, other neurologic disease considered for her paresthesias.  She has had similar symptoms in  the past with normal MRI however given blurry vision and new right lower extremity paresthesias will obtain MRI brain to rule out stroke.  Chest pain seems somewhat atypical.  Does not seem to be exertional described as more of a tightness in the chest.  Aggravated with breathing.  On my initial assessment in triage heart rate was 95.  No murmurs rubs or gallops.   Co morbidities: Discussed in HPI   Brief History:  Patient is a 56 year old female with a past medical history significant for episodes of dizziness and right upper extremity paresthesias.  She has had MRIs done twice in the past which were negative for stroke.  Patient states that she started having some paresthesias/tingling/numbness in her right upper extremity 4 days ago with progressive worsening over the last 4 days.  She states that this morning she feels that her chest became somewhat tight and she states that she feels that the room is at times wobbling or spinning.  She states she has some mild blurriness in her right eye.  She denies any slurred speech or difficulty swallowing.  She states that her symptoms have been constant but slightly worsening today.  She states she has been able to walk without difficulty and has not been limping.  She also notices that her paresthesias seem to be affecting her right leg as well  She also endorses some chest tightness which she states she noticed worsening this morning.  She states that she has not had any nausea vomiting or difficulty breathing.    EMR reviewed including pt PMHx, past surgical history and past visits to ER.   See HPI for more details   Lab Tests:   I ordered and independently interpreted labs. Labs notable for acid issues needing pregnancy, troponin x2 within normal limits, BMP with mild hypokalemia 3.4, CBC unremarkable.   Imaging Studies:  NAD. I personally reviewed all imaging studies and no acute abnormality found. I agree with radiology  interpretation.  MRI brain normal  IMPRESSION:  Normal brain MRI.  No acute abnormality.      Electronically Signed    By: Margaretha Sheffield M.D.    On: 12/08/2021 11:32   X-ray of chest without pneumothorax pneumonia or other abnormal  finding   Cardiac Monitoring:  NA EKG non-ischemic   Medicines ordered:  I ordered medication including Tylenol for pain Reevaluation of the patient after these medicines showed that the patient resolved I have reviewed the patients home medicines and have made adjustments as needed   Critical Interventions:     Consults/Attending Physician      Reevaluation:  After the interventions noted above I re-evaluated patient and found that they have :resolved   Social Determinants of Health:      Problem List / ED Course:  On my reassessment patient is now had resolution of her chest pressure and states that her blurry vision and right lower extremity paresthesias have resolved.  She does endorse continued right upper extremity paresthesias.  She states she feels much improved after Tylenol.  She denies any shortness of breath nausea or vomiting.  No other associate symptoms. Atypical chest pain.  Resolved with Tylenol and a lidocaine patch on the area of tenderness in her chest wall/armpit. Paresthesias will need to follow-up with neurology   Dispostion:  After consideration of the diagnostic results and the patients response to treatment, I feel that the patent would benefit from close outpatient follow-up with neurology.  Strict return precautions discussed.  Final Clinical Impression(s) / ED Diagnoses Final diagnoses:  Paresthesias  Atypical chest pain    Rx / DC Orders ED Discharge Orders     None         Tedd Sias, [place] 12/09/21 2157    Pattricia Boss, MD 12/14/21 [phone]

## 2021-12-08 NOTE — ED Provider Triage Note (Addendum)
Emergency Medicine Provider Triage Evaluation Note  Michelle Yang , a 56 y.o. female  was evaluated in triage.  Pt complains of R side of face tingling/paresthesias and states R arm as well and R foot.   States she also feels like her whole right side is weak. Still can walk without difficulty and no limp.   Blurry vision in R eye.   She states no slurred, no difficulty swallowing. States these symptoms were noticed Sunday "throughout the day"  Endorses CP that is a "tightness"   Review of Systems  Positive: Weakness, tingling.  Negative: Fever  Physical Exam  BP (!) 150/102 (BP Location: Right Arm)   Pulse (!) 118   Temp 98 F (36.7 C) (Oral)   Resp 18   Ht 5\' 4"  (1.626 m)   Wt 91.2 kg   LMP 01/13/2017 Comment: saturday  SpO2 100%   BMI 34.50 kg/m  Gen:   Awake, no distress   Resp:  Normal effort  MSK:   Moves extremities without difficulty  Other:  Sensory and motor exam unremarkable. Smile symmetric.   Medical Decision Making  Medically screening exam initiated at 9:30 AM.  Appropriate orders placed.  Michelle Yang was informed that the remainder of the evaluation will be completed by another provider, this initial triage assessment does not replace that evaluation, and the importance of remaining in the ED until their evaluation is complete.  Given blurred vision and perceived weakness and her stated hx of stroke will obtain MRI brain.  CP orders also placed.   On review of EMR it appears pt has had two prior episodes like this w normal MRI both times.   Tedd Sias, [place] 12/08/21 0935    Tedd Sias, PA 12/08/21 [phone]

## 2022-02-05 ENCOUNTER — Ambulatory Visit
Admission: EM | Admit: 2022-02-05 | Discharge: 2022-02-05 | Disposition: A | Discharge status: Home / Self Care | Payer: BC Managed Care – PPO | Attending: Physician Assistant | Admitting: Physician Assistant

## 2022-02-05 DIAGNOSIS — K0889 Other specified disorders of teeth and supporting structures: Secondary | ICD-10-CM

## 2022-02-05 HISTORY — DX: Essential (primary) hypertension: I10

## 2022-02-05 MED ORDER — AMOXICILLIN 500 MG PO CAPS: 500.0000 mg | ORAL_CAPSULE | Freq: Three times a day (TID) | ORAL | 0 refills | Status: DC

## 2022-02-05 MED ORDER — IBUPROFEN 600 MG PO TABS: 600.0000 mg | ORAL_TABLET | Freq: Four times a day (QID) | ORAL | 0 refills | Status: DC | PRN

## 2022-02-05 NOTE — ED Provider Notes (Signed)
[name]    CSN: [id number] Arrival date & time: 02/05/22  0801      History   Chief Complaint Chief Complaint  Patient presents with   Dental Pain    HPI Michelle Yang is a 56 y.o. female.   Patient here today for evaluation of left upper dental pain that started yesterday and worsened overnight.  She reports that she has known dental issues and is getting dental insurance effective the beginning of the year.  She has not had any fever with current episode but has had some nausea.  Blood pressure is extremely elevated this morning but states she has not taken her medication.  She denies any chest pain or shortness of breath.  The history is provided by the patient.  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 Authorizing Provider  amoxicillin (AMOXIL) 500 MG capsule Take 1 capsule (500 mg total) by mouth 3 (three) times daily. 02/05/22  Yes Tomi Bamberger, PA-C  ibuprofen (ADVIL) 600 MG tablet Take 1 tablet (600 mg total) by mouth every 6 (six) hours as needed. 02/05/22  Yes Tomi Bamberger, PA-C   atorvastatin (LIPITOR) 20 MG tablet Take 20 mg by mouth daily. 11/28/21   [provider]  hydrochlorothiazide (MICROZIDE) 12.5 MG capsule Take 12.5 mg by mouth daily. 09/05/21   [provider]  losartan (COZAAR) 100 MG tablet Take 100 mg by mouth daily. 11/14/21   [provider]  triamcinolone (KENALOG) 0.025 % cream Apply 1 application topically 2 (two) times daily as needed (Apply to affected areas twice daily as needed). 07/03/20   Bing Neighbors, FNP    Family History Family History  Problem Relation Age of Onset   Diabetes Father     Social History Social History   Tobacco Use   Smoking status: Never   Smokeless tobacco: Never  Substance Use Topics   Alcohol use: No   Drug use: No     Allergies   Patient has no known allergies.   Review of Systems Review of Systems  Constitutional:  Negative for chills and fever.  HENT:  Positive for dental problem.   Eyes:  Negative for discharge and redness.  Respiratory:  Negative for shortness of breath.   Cardiovascular:  Negative for chest pain.  Gastrointestinal:  Negative for abdominal pain, nausea and vomiting.     Physical Exam Triage Vital Signs ED Triage Vitals  Enc Vitals Group     BP 02/05/22 0833 (!) 182/134     Pulse Rate 02/05/22 0833 90     Resp 02/05/22 0833 16     Temp 02/05/22 0833 98 F (36.7 C)     Temp Source 02/05/22 0833 Oral     SpO2 02/05/22 0833 97 %     Weight --      Height --      Head Circumference --      Peak Flow --      Pain Score 02/05/22 0832 10     Pain Loc --      Pain Edu? --      Excl. in GC? --    No data found.  Updated Vital Signs BP (!) 182/134 (BP Location: Left Arm)   Pulse 90   Temp 98 F (36.7 C) (Oral)   Resp 16   LMP 01/13/2017 Comment: saturday  SpO2 97%     Physical Exam Vitals and nursing note reviewed.  Constitutional:      General: She is not in acute distress.    Appearance: Normal appearance. She is not  ill-appearing.  HENT:     Head: Normocephalic and atraumatic.     Mouth/Throat:     Mouth: Mucous membranes are moist.     Comments: Poor dentition throughout, area of swelling and increased pain noted to left upper molars where teeth are missing, multiple caries Eyes:     Conjunctiva/sclera: Conjunctivae normal.  Cardiovascular:     Rate and Rhythm: Normal rate.  Pulmonary:     Effort: Pulmonary effort is normal.  Neurological:     Mental Status: She is alert.  Psychiatric:        Mood and Affect: Mood normal.        Behavior: Behavior normal.        Thought Content: Thought content normal.      UC Treatments / Results  Labs (all labs ordered are listed, but only abnormal results are displayed) Labs Reviewed - No data to display  EKG   Radiology No results found.  Procedures Procedures (including critical care time)  Medications Ordered in UC Medications - No data to display  Initial Impression / Assessment and Plan / UC Course  I have reviewed the triage vital signs and the nursing notes.  Pertinent labs & imaging results that were available during my care of the patient were reviewed by me and considered in my medical decision making (see chart for details).    Amoxicillin and ibuprofen prescribed for dental pain.  Advised patient to take her blood pressure medicine as soon as she gets home.  Recommended sooner follow-up with any further concerns.  Final Clinical Impressions(s) / UC Diagnoses   Final diagnoses:  Pain, dental   Discharge Instructions   None    ED Prescriptions     Medication Sig Dispense Auth. Provider   amoxicillin (AMOXIL) 500 MG capsule Take 1 capsule (500 mg total) by mouth 3 (three) times daily. 21 capsule Erma Pinto F, PA-C   ibuprofen (ADVIL) 600 MG tablet Take 1 tablet (600 mg total) by mouth every 6 (six) hours as needed. 30 tablet Tomi Bamberger, PA-C      PDMP not reviewed this encounter.   Tomi Bamberger,  PA-C 02/05/22 [phone]

## 2022-02-05 NOTE — ED Triage Notes (Signed)
Pt c/o severe toothache worse when laying down onset ~ last night.   Not best historian of current medications. States has not taken any medications today.

## 2022-12-11 ENCOUNTER — Ambulatory Visit
Admission: EM | Admit: 2022-12-11 | Discharge: 2022-12-11 | Disposition: A | Discharge status: Home / Self Care | Payer: BC Managed Care – PPO | Attending: Internal Medicine | Admitting: Internal Medicine

## 2022-12-11 ENCOUNTER — Other Ambulatory Visit: Payer: Self-pay

## 2022-12-11 ENCOUNTER — Ambulatory Visit: Payer: BC Managed Care – PPO

## 2022-12-11 DIAGNOSIS — K047 Periapical abscess without sinus: Secondary | ICD-10-CM

## 2022-12-11 DIAGNOSIS — R053 Chronic cough: Secondary | ICD-10-CM | POA: Diagnosis not present

## 2022-12-11 DIAGNOSIS — R059 Cough, unspecified: Secondary | ICD-10-CM

## 2022-12-11 MED ORDER — PREDNISONE 20 MG PO TABS: 40.0000 mg | ORAL_TABLET | Freq: Every day | ORAL | 0 refills | Status: AC

## 2022-12-11 MED ORDER — AMOXICILLIN-POT CLAVULANATE 875-125 MG PO TABS: 1.0000 | ORAL_TABLET | Freq: Two times a day (BID) | ORAL | 0 refills | Status: DC

## 2022-12-11 MED ORDER — BENZONATATE 100 MG PO CAPS: 100.0000 mg | ORAL_CAPSULE | Freq: Three times a day (TID) | ORAL | 0 refills | Status: DC | PRN

## 2022-12-11 MED ORDER — ALBUTEROL SULFATE HFA 108 (90 BASE) MCG/ACT IN AERS: 1.0000 | INHALATION_SPRAY | Freq: Four times a day (QID) | RESPIRATORY_TRACT | 0 refills | Status: AC | PRN

## 2022-12-11 NOTE — Discharge Instructions (Signed)
I will call with x-ray results are abnormal.  I have prescribed you four medications to help alleviate symptoms.  Please take them with food to avoid stomach upset.  Follow-up with primary care doctor if symptoms persist or worsen.

## 2022-12-11 NOTE — ED Provider Notes (Signed)
[name]    CSN: [id number] Arrival date & time: 12/11/22  0810      History   Chief Complaint No chief complaint on file.   HPI Michelle Yang is a 57 y.o. female.   Patient presents with 2 different chief complaints today.  Patient reports productive cough for 3 to 4 weeks with associated runny nose.  Denies any fever or known sick contacts.  Denies history of asthma or COPD and patient reports that she has never consistently smoked cigarettes.  Patient states that she has been having some intermittent shortness of breath as well.  Has been taking DayQuil when symptoms first started but has not taken anything recently.  Patient also had concern for infection of the mouth given she noticed some "bumps" in the left upper and lower portion of the mouth.  She recently got dental insurance so has been trying to get in with a dentist recently.     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 Authorizing Provider  albuterol (VENTOLIN HFA) 108 (90 Base) MCG/ACT inhaler Inhale 1-2 puffs into the lungs every 6 (six) hours as needed for wheezing or shortness of  breath. 12/11/22  Yes Rozell Kettlewell, Rolly Salter E, FNP  amoxicillin-clavulanate (AUGMENTIN) 875-125 MG tablet Take 1 tablet by mouth every 12 (twelve) hours. 12/11/22  Yes Vanity Larsson, Rolly Salter E, FNP  benzonatate (TESSALON) 100 MG capsule Take 1 capsule (100 mg total) by mouth every 8 (eight) hours as needed for cough. 12/11/22  Yes Abeni Finchum, Rolly Salter E, FNP  predniSONE (DELTASONE) 20 MG tablet Take 2 tablets (40 mg total) by mouth daily for 5 days. 12/11/22 12/16/22 Yes Kendallyn Lippold, Acie Fredrickson, FNP  atorvastatin (LIPITOR) 20 MG tablet Take 20 mg by mouth daily. 11/28/21   [provider]  hydrochlorothiazide (MICROZIDE) 12.5 MG capsule Take 12.5 mg by mouth daily. 09/05/21   [provider]  ibuprofen (ADVIL) 600 MG tablet Take 1 tablet (600 mg total) by mouth every 6 (six) hours as needed. 02/05/22   Tomi Bamberger, PA-C  losartan (COZAAR) 100 MG tablet Take 100 mg by mouth daily. 11/14/21   [provider]  triamcinolone (KENALOG) 0.025 % cream Apply 1 application topically 2 (two) times daily as needed (Apply to affected areas twice daily as needed). 07/03/20   Bing Neighbors, NP    Family History Family History  Problem Relation Age of Onset   Diabetes Father     Social History Social History   Tobacco Use   Smoking status: Never   Smokeless tobacco: Never  Substance Use Topics   Alcohol use: No   Drug use: No     Allergies   Patient has no known allergies.   Review of Systems Review of Systems Per HPI  Physical Exam Triage Vital Signs ED Triage Vitals  Encounter Vitals Group     BP 12/11/22 0826 (!) 143/95     Systolic BP Percentile --      Diastolic BP Percentile --      Pulse Rate 12/11/22 0826 (!) 105     Resp 12/11/22 0826 16     Temp 12/11/22 0826 98.7 F (37.1 C)     Temp Source 12/11/22 0826 Oral     SpO2 12/11/22 0826 99 %     Weight 12/11/22 0825 200 lb (90.7 kg)     Height 12/11/22 0825 5\' 4"  (1.626 m)     Head Circumference --      Peak Flow --       Pain Score 12/11/22 0825 0     Pain Loc --      Pain Education --      Exclude from Growth Chart --    No data found.  Updated Vital Signs BP (!) 143/95 (BP Location: Left Arm)   Pulse (!) 105   Temp 98.7 F (37.1 C) (Oral)   Resp 16   Ht 5\' 4"  (1.626 m)   Wt 200 lb (90.7 kg)   LMP 01/13/2017 Comment: saturday  SpO2 99%   BMI 34.33 kg/m   Visual Acuity Right Eye Distance:   Left Eye Distance:   Bilateral Distance:    Right Eye Near:   Left Eye Near:    Bilateral Near:     Physical Exam Constitutional:      General: She is not in acute distress.    Appearance: Normal appearance. She is not toxic-appearing or diaphoretic.  HENT:     Head: Normocephalic and atraumatic.     Right Ear: Tympanic membrane and ear canal normal.     Left Ear: Tympanic membrane and ear canal normal.     Nose: Congestion present.     Mouth/Throat:     Mouth: Mucous membranes are moist.     Pharynx: No posterior oropharyngeal erythema.     Comments: Patient has a very small pinpoint lesion present to left upper outer gingiva.  No significant surrounding swelling or erythema.  Lower dentition appears normal. Eyes:     Extraocular Movements: Extraocular movements intact.     Conjunctiva/sclera: Conjunctivae normal.     Pupils: Pupils are equal, round, and reactive to light.  Cardiovascular:     Rate and Rhythm: Normal rate and regular rhythm.     Pulses: Normal pulses.     Heart sounds: Normal heart sounds.  Pulmonary:     Effort: Pulmonary effort is normal. No respiratory distress.     Breath sounds: Normal breath sounds. No stridor. No wheezing, rhonchi or rales.  Abdominal:     General: Abdomen is flat. Bowel sounds are normal.     Palpations: Abdomen is soft.  Musculoskeletal:        General: Normal range of motion.     Cervical back: Normal range of motion.  Skin:    General: Skin is warm and dry.  Neurological:     General:  No focal deficit present.     Mental Status: She is  alert and oriented to person, place, and time. Mental status is at baseline.  Psychiatric:        Mood and Affect: Mood normal.        Behavior: Behavior normal.      UC Treatments / Results  Labs (all labs ordered are listed, but only abnormal results are displayed) Labs Reviewed - No data to display  EKG   Radiology DG Chest 2 View  Result Date: 12/11/2022 CLINICAL DATA:  Cough for several weeks. Shortness of breath and fatigue. EXAM: CHEST - 2 VIEW COMPARISON:  12/08/2021 FINDINGS: The heart size and mediastinal contours are within normal limits. Both lungs are clear. The visualized skeletal structures are unremarkable. IMPRESSION: No active cardiopulmonary disease. Electronically Signed   By: Danae Orleans M.D.   On: 12/11/2022 10:39    Procedures Procedures (including critical care time)  Medications Ordered in UC Medications - No data to display  Initial Impression / Assessment and Plan / UC Course  I have reviewed the triage vital signs and the nursing notes.  Pertinent labs & imaging results that were available during my care of the patient were reviewed by me and considered in my medical decision making (see chart for details).     1.  Persistent cough  Differential diagnoses include pneumonia versus postviral cough versus acute bronchitis.  Chest x-ray completed that was negative for any acute cardiopulmonary process.  Will treat with prednisone, benzonatate, albuterol inhaler.  Patient has taken prednisone previously and tolerated well with no significant contraindications to steroid therapy noted in patient's history.  Advised strict follow-up if cough persists or worsens.  2.  Dental infection  It appears the patient has dental infection on exam.  Will treat with Augmentin.  Advised to follow-up with dentist as soon as possible as well.  Patient verbalized understanding and was agreeable with plan. Final Clinical Impressions(s) / UC Diagnoses   Final diagnoses:   Persistent cough for 3 weeks or longer  Dental infection     Discharge Instructions      I will call with x-ray results are abnormal.  I have prescribed you four medications to help alleviate symptoms.  Please take them with food to avoid stomach upset.  Follow-up with primary care doctor if symptoms persist or worsen.    ED Prescriptions     Medication Sig Dispense Auth. Provider   amoxicillin-clavulanate (AUGMENTIN) 875-125 MG tablet Take 1 tablet by mouth every 12 (twelve) hours. 14 tablet [place], [place] [address], [place]   predniSONE (DELTASONE) 20 MG tablet Take 2 tablets (40 mg total) by mouth daily for 5 days. 10 tablet [place], [place] [address], [place]   benzonatate (TESSALON) 100 MG capsule Take 1 capsule (100 mg total) by mouth every 8 (eight) hours as needed for cough. 21 capsule [place], [place] [address], [place]   albuterol (VENTOLIN HFA) 108 (90 Base) MCG/ACT inhaler Inhale 1-2 puffs into the lungs every 6 (six) hours as needed for wheezing or shortness of breath. 1 each Gustavus Bryant, [place]      PDMP not reviewed this encounter.   Gustavus Bryant, [place] 12/11/22 1114

## 2022-12-11 NOTE — ED Triage Notes (Signed)
Patient reports coughing 3/4 weeks, chest tightness, fatigue. Concerns of walking pneumonia. Treated with Dayquil. No relief.

## 2022-12-29 ENCOUNTER — Other Ambulatory Visit: Payer: Self-pay

## 2022-12-29 ENCOUNTER — Emergency Department (HOSPITAL_COMMUNITY)
Admission: EM | Admit: 2022-12-29 | Discharge: 2022-12-30 | Disposition: A | Discharge status: Home / Self Care | Payer: BC Managed Care – PPO | Attending: Emergency Medicine | Admitting: Emergency Medicine

## 2022-12-29 ENCOUNTER — Ambulatory Visit: Payer: BC Managed Care – PPO

## 2022-12-29 ENCOUNTER — Ambulatory Visit
Admission: EM | Admit: 2022-12-29 | Discharge: 2022-12-29 | Disposition: A | Discharge status: Home / Self Care | Payer: BC Managed Care – PPO | Attending: Internal Medicine | Admitting: Internal Medicine

## 2022-12-29 ENCOUNTER — Emergency Department (HOSPITAL_COMMUNITY): Payer: BC Managed Care – PPO

## 2022-12-29 ENCOUNTER — Encounter (HOSPITAL_COMMUNITY): Payer: Self-pay | Admitting: Emergency Medicine

## 2022-12-29 DIAGNOSIS — M79674 Pain in right toe(s): Secondary | ICD-10-CM

## 2022-12-29 DIAGNOSIS — R079 Chest pain, unspecified: Secondary | ICD-10-CM | POA: Insufficient documentation

## 2022-12-29 DIAGNOSIS — R052 Subacute cough: Secondary | ICD-10-CM | POA: Diagnosis not present

## 2022-12-29 DIAGNOSIS — Z8673 Personal history of transient ischemic attack (TIA), and cerebral infarction without residual deficits: Secondary | ICD-10-CM | POA: Insufficient documentation

## 2022-12-29 DIAGNOSIS — I10 Essential (primary) hypertension: Secondary | ICD-10-CM | POA: Insufficient documentation

## 2022-12-29 DIAGNOSIS — R42 Dizziness and giddiness: Secondary | ICD-10-CM | POA: Diagnosis not present

## 2022-12-29 LAB — BASIC METABOLIC PANEL
Anion gap: 8 (ref 5–15)
BUN: 13 mg/dL (ref 6–20)
CO2: 23 mmol/L (ref 22–32)
Calcium: 9.4 mg/dL (ref 8.9–10.3)
Chloride: 110 mmol/L (ref 98–111)
Creatinine, Ser: 1.05 mg/dL — ABNORMAL HIGH (ref 0.44–1.00)
GFR, Estimated: >60 mL/min (ref >60)
Glucose, Bld: 94 mg/dL (ref 70–99)
Potassium: 4 mmol/L (ref 3.5–5.1)
Sodium: 141 mmol/L (ref 135–145)

## 2022-12-29 LAB — TROPONIN I (HIGH SENSITIVITY)
Troponin I (High Sensitivity): 3 ng/L (ref <18)
Troponin I (High Sensitivity): 3 ng/L (ref <18)

## 2022-12-29 LAB — CBC
HCT: 44.7 % (ref 36.0–46.0)
Hemoglobin: 14 g/dL (ref 12.0–15.0)
MCH: 26.8 pg (ref 26.0–34.0)
MCHC: 31.3 g/dL (ref 30.0–36.0)
MCV: 85.5 fL (ref 80.0–100.0)
Platelets: 305 10*3/uL (ref 150–400)
RBC: 5.23 MIL/uL — ABNORMAL HIGH (ref 3.87–5.11)
RDW: 14.8 % (ref 11.5–15.5)
WBC: 6.8 10*3/uL (ref 4.0–10.5)
nRBC: 0 % (ref 0.0–0.2)

## 2022-12-29 NOTE — ED Provider Notes (Signed)
[name]    CSN: [id number] Arrival date & time: 12/29/22  0813      History   Chief Complaint No chief complaint on file.   HPI Michelle Yang is a 57 y.o. female.   Patient presents with right toe pain after an injury that occurred this morning.  Reports that she hit her pinky toe on the bed this morning.  She has not taken anything for pain.  Denies any pain to any other portion of the foot.  Patient is not reporting any numbness or tingling.     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 Authorizing Provider  albuterol (VENTOLIN HFA) 108 (90 Base) MCG/ACT inhaler Inhale 1-2 puffs into the lungs every 6 (six) hours as needed for wheezing or shortness of breath. 12/11/22   Gustavus Bryant, FNP  amoxicillin-clavulanate (AUGMENTIN) 875-125 MG tablet Take 1 tablet by mouth every 12 (twelve) hours. 12/11/22   Gustavus Bryant, FNP  atorvastatin (LIPITOR) 20 MG tablet Take 20 mg by mouth daily. 11/28/21   [provider]  benzonatate (TESSALON) 100 MG capsule Take 1 capsule (100 mg total) by mouth every 8 (eight) hours as needed for cough.  12/11/22   Gustavus Bryant, FNP  hydrochlorothiazide (MICROZIDE) 12.5 MG capsule Take 12.5 mg by mouth daily. 09/05/21   [provider]  ibuprofen (ADVIL) 600 MG tablet Take 1 tablet (600 mg total) by mouth every 6 (six) hours as needed. 02/05/22   Tomi Bamberger, PA-C  losartan (COZAAR) 100 MG tablet Take 100 mg by mouth daily. 11/14/21   [provider]  triamcinolone (KENALOG) 0.025 % cream Apply 1 application topically 2 (two) times daily as needed (Apply to affected areas twice daily as needed). 07/03/20   Bing Neighbors, NP    Family History Family History  Problem Relation Age of Onset   Diabetes Father     Social History Social History   Tobacco Use   Smoking status: Never   Smokeless tobacco: Never  Substance Use Topics   Alcohol use: No   Drug use: No     Allergies   Patient has no known allergies.   Review of Systems Review of Systems Per HPI  Physical Exam Triage Vital Signs ED Triage Vitals  Encounter Vitals Group     BP 12/29/22 0841 (!) 148/95  Systolic BP Percentile --      Diastolic BP Percentile --      Pulse Rate 12/29/22 0841 86     Resp 12/29/22 0841 18     Temp 12/29/22 0841 98.5 F (36.9 C)     Temp Source 12/29/22 0841 Oral     SpO2 12/29/22 0841 98 %     Weight 12/29/22 0840 200 lb (90.7 kg)     Height 12/29/22 0840 5\' 4"  (1.626 m)     Head Circumference --      Peak Flow --      Pain Score 12/29/22 0840 6     Pain Loc --      Pain Education --      Exclude from Growth Chart --    No data found.  Updated Vital Signs BP (!) 148/95 (BP Location: Left Arm)   Pulse 86   Temp 98.5 F (36.9 C) (Oral)   Resp 18   Ht 5\' 4"  (1.626 m)   Wt 200 lb (90.7 kg)   LMP 01/13/2017 Comment: saturday  SpO2 98%   BMI 34.33 kg/m   Visual Acuity Right Eye Distance:   Left Eye Distance:   Bilateral Distance:    Right Eye Near:   Left Eye Near:    Bilateral Near:     Physical Exam Constitutional:      General:  She is not in acute distress.    Appearance: Normal appearance. She is not toxic-appearing or diaphoretic.  HENT:     Head: Normocephalic and atraumatic.  Eyes:     Extraocular Movements: Extraocular movements intact.     Conjunctiva/sclera: Conjunctivae normal.  Pulmonary:     Effort: Pulmonary effort is normal.  Feet:     Comments: Tenderness to palpation to distal portion of right fifth digit of right foot.  Very minimal swelling noted.  Nail is intact with no subungual hematoma.  Capillary refill and pulses intact.  Patient can wiggle toes.  No other tenderness to palpation other toes or foot. Neurological:     General: No focal deficit present.     Mental Status: She is alert and oriented to person, place, and time. Mental status is at baseline.  Psychiatric:        Mood and Affect: Mood normal.        Behavior: Behavior normal.        Thought Content: Thought content normal.        Judgment: Judgment normal.      UC Treatments / Results  Labs (all labs ordered are listed, but only abnormal results are displayed) Labs Reviewed - No data to display  EKG   Radiology DG Toe 5th Right  Result Date: 12/29/2022 CLINICAL DATA:  Stubbed little today.  Pain and swelling. EXAM: RIGHT FIFTH TOE-3-VIEW COMPARISON:  None Available. FINDINGS: There is no evidence of fracture or dislocation. There is no evidence of arthropathy or other focal bone abnormality. Soft tissue swelling is noted distally. IMPRESSION: Soft tissue swelling. No evidence of fracture. Electronically Signed   By: Danae Orleans M.D.   On: 12/29/2022 10:14    Procedures Procedures (including critical care time)  Medications Ordered in UC Medications - No data to display  Initial Impression / Assessment and Plan / UC Course  I have reviewed the triage vital signs and the nursing notes.  Pertinent labs & imaging results that were available during my care of the patient were reviewed by me and considered in my  medical decision making (see chart for details).     X-ray negative for any acute bony abnormality.  Buddy tape applied prior to discharge to help with support and stability.  Advised supportive care including ice application and elevation.  Advised to follow-up with provided contact information for podiatry if symptoms persist or worsen.  Patient verbalized understanding and was agreeable with plan. Final Clinical Impressions(s) / UC Diagnoses   Final diagnoses:  Pain in right toe(s)     Discharge Instructions      I will call if x-ray is abnormal.  Your toes have been buddy taped to help with support and stability.  Follow-up with foot doctor if symptoms persist or worsen.     ED Prescriptions   None    PDMP not reviewed this encounter.   Gustavus Bryant, [place] 12/29/22 1056

## 2022-12-29 NOTE — Discharge Instructions (Signed)
I will call if x-ray is abnormal.  Your toes have been buddy taped to help with support and stability.  Follow-up with foot doctor if symptoms persist or worsen.

## 2022-12-29 NOTE — ED Triage Notes (Signed)
Pt reports CP and dizziness that started on Monday of this week. PT endorses cough and having "cold."

## 2022-12-29 NOTE — ED Triage Notes (Signed)
Patient states "I stomped my right pinky toe on bed this morning, swollen and painful. I want to make sure it is not broken. It hurts to walk."

## 2022-12-30 MED ORDER — AZITHROMYCIN 250 MG PO TABS: ORAL_TABLET | ORAL | 0 refills | Status: DC

## 2022-12-30 NOTE — ED Provider Notes (Signed)
[facility] Emergency Department Provider Note MRN:  [id number]  Arrival date & time: 12/30/22     Chief Complaint   Chest Pain   History of Present Illness   Michelle Yang is a 57 y.o. year-old female with a history of hypertension presenting to the ED with chief complaint of chest pain.  Patient started with a URI a month ago, developed a cough which has not gone away.  Productive cough, getting a bit less productive over the past few days.  Having intermittent right-sided chest pain described as a pressure associated with dizziness.  Not currently having chest pain.  Denies shortness of breath, no leg pain or swelling.  Review of Systems  A thorough review of systems was obtained and all systems are negative except as noted in the HPI and PMH.   Patient's Health History    Past Medical History:  Diagnosis Date   Hypertension    Medical history non-contributory    Stroke [facility])     Past Surgical History:  Procedure Laterality Date   ANKLE FRACTURE SURGERY Left     Family History  Problem Relation Age of Onset   Diabetes Father     Social History   Socioeconomic History   Marital status: Single    Spouse name: Not on file   Number of children: Not on file   Years of education: Not on file   Highest education level: Not on file  Occupational History   Not on file  Tobacco Use   Smoking status: Never   Smokeless tobacco: Never  Vaping Use   Vaping status: Not on file  Substance and Sexual Activity   Alcohol use: No   Drug use: No   Sexual activity: Never    Birth control/protection: None  Other Topics Concern   Not on file  Social History Narrative   Not on file   Social Determinants of Health   Financial Resource Strain: Not on file  Food Insecurity: Not on file  Transportation Needs: Not on file  Physical Activity: Not on file  Stress: Not on file  Social Connections: Not on file  Intimate Partner Violence: Not on file      Physical Exam   Vitals:   12/29/22 2314 12/30/22 0140  BP: (!) 130/90 (!) 131/96  Pulse: (!) 103 96  Resp: 20 18  Temp: 97.6 F (36.4 C) 98 F (36.7 C)  SpO2: 98% 97%    CONSTITUTIONAL: Well-appearing, NAD NEURO/PSYCH:  Alert and oriented x 3, no focal deficits EYES:  eyes equal and reactive ENT/NECK:  no LAD, no JVD CARDIO: Regular rate, well-perfused, normal S1 and S2 PULM:  CTAB no wheezing or rhonchi GI/GU:  non-distended, non-tender MSK/SPINE:  No gross deformities, no edema SKIN:  no rash, atraumatic   *Additional and/or pertinent findings included in MDM below  Diagnostic and Interventional Summary    EKG Interpretation Date/Time:  Friday December 29 2022 15:50:26 EST Ventricular Rate:  90 PR Interval:  152 QRS Duration:  70 QT Interval:  362 QTC Calculation: 442 R Axis:   5  Text Interpretation: Normal sinus rhythm Minimal voltage criteria for LVH, may be normal variant ( R in aVL ) Cannot rule out Anterior infarct , age undetermined Abnormal ECG When compared with ECG of 08-Dec-2021 09:09, PREVIOUS ECG IS PRESENT Confirmed by Kennis Carina [phone]) on 12/30/2022 1:20:03 AM       Labs Reviewed  BASIC METABOLIC PANEL - Abnormal; Notable for the following  components:      Result Value   Creatinine, Ser 1.05 (*)    All other components within normal limits  CBC - Abnormal; Notable for the following components:   RBC 5.23 (*)    All other components within normal limits  TROPONIN I (HIGH SENSITIVITY)  TROPONIN I (HIGH SENSITIVITY)    DG Chest 2 View  Final Result      Medications - No data to display   Procedures  /  Critical Care Procedures  ED Course and Medical Decision Making  Initial Impression and Ddx Suspect MSK or pneumonia or cough related chest pain however ACS is considered.  Nothing to suggest PE.  Past medical/surgical history that increases complexity of ED encounter: History of TIA in the past.  Had a normal MRI last year.   There is documentation of a history of stroke but this is seemingly untrue.  Interpretation of Diagnostics I personally reviewed the EKG and my interpretation is as follows: Sinus rhythm without concerning ischemic findings  Labs reassuring with no significant blood count or electrolyte disturbance, troponin negative x 2.  Chest x-ray normal.  Patient Reassessment and Ultimate Disposition/Management     Patient is without chest pain, negative troponins, reassuring EKG, heart score of 3.  Has already tried albuterol inhaler, Tessalon, Augmentin at home without improvement in her cough.  No indication for further testing or admission, appropriate for discharge.  Patient management required discussion with the following services or consulting groups:  None  Complexity of Problems Addressed Acute illness or injury that poses threat of life of bodily function  Additional Data Reviewed and Analyzed Further history obtained from: Further history from spouse/family member  Additional Factors Impacting ED Encounter Risk Prescriptions  Elmer Sow. Pilar Plate, MD [facility] Emergency Medicine [facility] [email or web address]  Final Clinical Impressions(s) / ED Diagnoses     ICD-10-CM   1. Chest pain, unspecified type  R07.9     2. Subacute cough  R05.2       ED Discharge Orders          Ordered    azithromycin (ZITHROMAX) 250 MG tablet        12/30/22 0150             Discharge Instructions Discussed with and Provided to Patient:    Discharge Instructions      You were evaluated in the Emergency Department and after careful evaluation, we did not find any emergent condition requiring admission or further testing in the hospital.  Your exam/testing today is overall reassuring.  Recommend follow-up with your primary care doctor to discuss your chest pain.  Take the azithromycin antibiotic as directed for your cough.  Please return to the Emergency Department if  you experience any worsening of your condition.   Thank you for allowing [place] to be a part of your care.      Sabas Sous, MD 12/30/22 [phone]

## 2022-12-30 NOTE — Discharge Instructions (Signed)
You were evaluated in the Emergency Department and after careful evaluation, we did not find any emergent condition requiring admission or further testing in the hospital.  Your exam/testing today is overall reassuring.  Recommend follow-up with your primary care doctor to discuss your chest pain.  Take the azithromycin antibiotic as directed for your cough.  Please return to the Emergency Department if you experience any worsening of your condition.   Thank you for allowing [place] to be a part of your care.

## 2023-04-06 ENCOUNTER — Encounter (HOSPITAL_COMMUNITY): Payer: Self-pay

## 2023-04-06 ENCOUNTER — Ambulatory Visit (HOSPITAL_COMMUNITY)
Admission: EM | Admit: 2023-04-06 | Discharge: 2023-04-06 | Disposition: A | Discharge status: Home / Self Care | Payer: 59 | Attending: Family Medicine | Admitting: Family Medicine

## 2023-04-06 DIAGNOSIS — U071 COVID-19: Secondary | ICD-10-CM | POA: Diagnosis not present

## 2023-04-06 LAB — POC COVID19/FLU A&B COMBO
Covid Antigen, POC: POSITIVE — AB
Influenza A Antigen, POC: NEGATIVE
Influenza B Antigen, POC: NEGATIVE

## 2023-04-06 MED ORDER — PAXLOVID (300/100) 20 X 150 MG & 10 X 100MG PO TBPK: 3.0000 | ORAL_TABLET | Freq: Two times a day (BID) | ORAL | 0 refills | Status: AC

## 2023-04-06 NOTE — ED Provider Notes (Signed)
[facility]    CSN: [id number] Arrival date & time: 04/06/23  1117      History   Chief Complaint Chief Complaint  Patient presents with   Cough    HPI Michelle Yang is a 58 y.o. female.    Cough Associated symptoms: chills and rhinorrhea   Patient is here for uri symptoms x 4 days.  Having cough, sore throat, body aches, chills.  No known fevers, as she just kept taking medications for her symptoms.  Some wheezing/sob.  No h/o asthma or copd.  Some nausea last night.  No known sick contacts.       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 Authorizing Provider  albuterol (VENTOLIN HFA) 108 (90 Base) MCG/ACT inhaler Inhale 1-2 puffs into the lungs every 6 (six) hours as needed for wheezing or shortness of breath. 12/11/22   Gustavus Bryant, FNP  amoxicillin-clavulanate (AUGMENTIN) 875-125 MG tablet Take 1 tablet by mouth every 12 (twelve) hours. 12/11/22   Gustavus Bryant, FNP  atorvastatin (LIPITOR) 20 MG tablet Take 20 mg by mouth daily. 11/28/21   [provider]  azithromycin (ZITHROMAX) 250 MG tablet Take 2 tablets together on the  first day, then 1 every day until finished. 12/30/22   Sabas Sous, MD  benzonatate (TESSALON) 100 MG capsule Take 1 capsule (100 mg total) by mouth every 8 (eight) hours as needed for cough. 12/11/22   Gustavus Bryant, FNP  hydrochlorothiazide (MICROZIDE) 12.5 MG capsule Take 12.5 mg by mouth daily. 09/05/21   [provider]  ibuprofen (ADVIL) 600 MG tablet Take 1 tablet (600 mg total) by mouth every 6 (six) hours as needed. 02/05/22   Tomi Bamberger, PA-C  losartan (COZAAR) 100 MG tablet Take 100 mg by mouth daily. 11/14/21   [provider]  triamcinolone (KENALOG) 0.025 % cream Apply 1 application topically 2 (two) times daily as needed (Apply to affected areas twice daily as needed). 07/03/20   Bing Neighbors, NP    Family History Family History  Problem Relation Age of Onset   Diabetes Father     Social History Social History   Tobacco Use   Smoking status: Never   Smokeless tobacco: Never  Substance Use Topics   Alcohol use: No   Drug use: No     Allergies  Patient has no known allergies.   Review of Systems Review of Systems  Constitutional:  Positive for chills and fatigue.  HENT:  Positive for congestion and rhinorrhea.   Respiratory:  Positive for cough.   Cardiovascular: Negative.   Gastrointestinal: Negative.   Genitourinary: Negative.   Musculoskeletal: Negative.   Psychiatric/Behavioral: Negative.       Physical Exam Triage Vital Signs ED Triage Vitals  Encounter Vitals Group     BP 04/06/23 1257 (!) 164/104     Systolic BP Percentile --      Diastolic BP Percentile --      Pulse Rate 04/06/23 1255 75     Resp 04/06/23 1255 16     Temp 04/06/23 1255 98.1 F (36.7 C)     Temp Source 04/06/23 1255 Oral     SpO2 04/06/23 1255 98 %     Weight --      Height --      Head Circumference --      Peak Flow --      Pain Score 04/06/23 1256 0     Pain Loc --      Pain Education --      Exclude from Growth Chart --    No  data found.  Updated Vital Signs BP (!) 164/104 (BP Location: Left Arm)   Pulse 75   Temp 98.1 F (36.7 C) (Oral)   Resp 16   LMP 01/13/2017 Comment: saturday  SpO2 98%   Visual Acuity Right Eye Distance:   Left Eye Distance:   Bilateral Distance:    Right Eye Near:   Left Eye Near:    Bilateral Near:     Physical Exam Constitutional:      General: She is not in acute distress.    Appearance: Normal appearance. She is normal weight. She is not ill-appearing or toxic-appearing.  HENT:     Right Ear: A middle ear effusion is present.     Left Ear: A middle ear effusion is present.     Nose: Congestion present.     Mouth/Throat:     Mouth: Mucous membranes are moist.  Cardiovascular:     Rate and Rhythm: Normal rate and regular rhythm.  Pulmonary:     Effort: Pulmonary effort is normal.     Breath sounds: Normal breath sounds.  Musculoskeletal:     Cervical back: Normal range of motion and neck supple. No tenderness.  Lymphadenopathy:     Cervical: No cervical adenopathy.  Skin:    General: Skin is warm.  Neurological:     General: No focal deficit present.     Mental Status: She is alert.  Psychiatric:        Mood and Affect: Mood normal.      UC Treatments / Results  Labs (all labs ordered are listed, but only abnormal results are displayed) Labs Reviewed  POC COVID19/FLU A&B COMBO - Abnormal; Notable for the following components:      Result Value   Covid Antigen, POC Positive (*)    All other components within normal limits    EKG   Radiology No results found.  Procedures Procedures (including critical care time)  Medications Ordered in UC Medications - No data to display  Initial Impression / Assessment and Plan / UC Course  I have reviewed the triage vital signs and the nursing notes.  Pertinent labs & imaging results that were available during my care of the patient were reviewed by me  and considered in my medical decision making (see  chart for details).  Final Clinical Impressions(s) / UC Diagnoses   Final diagnoses:  COVID-19     Discharge Instructions      You were diagnosed with covid-19 infection.  I have sent out paxlovid to treat this.  Please get started on this today.  You should NOT take the lipitor while taking paxlovid.  You may use tylenol/motrin for headache and body aches, and over the counter medications for cough and sinus congestion.  Return if not improving.     ED Prescriptions     Medication Sig Dispense Auth. Provider   nirmatrelvir/ritonavir (PAXLOVID, 300/100,) 20 x 150 MG & 10 x 100MG  TBPK Take 3 tablets by mouth 2 (two) times daily for 5 days. Patient GFR is >60 Take nirmatrelvir (150 mg) two tablets twice daily for 5 days and ritonavir (100 mg) one tablet twice daily for 5 days. 30 tablet Jannifer Franklin, MD      PDMP not reviewed this encounter.   Jannifer Franklin, MD 04/06/23 [phone]

## 2023-04-06 NOTE — ED Triage Notes (Signed)
Pt states cough,sore throat,weakness,dizzy, body aches and chills. States she has been taking therflu at home.

## 2023-04-06 NOTE — Discharge Instructions (Addendum)
You were diagnosed with covid-19 infection.  I have sent out paxlovid to treat this.  Please get started on this today.  You should NOT take the lipitor while taking paxlovid.  You may use tylenol/motrin for headache and body aches, and over the counter medications for cough and sinus congestion.  Return if not improving.

## 2023-05-19 ENCOUNTER — Ambulatory Visit: Admission: EM | Admit: 2023-05-19 | Discharge: 2023-05-19 | Disposition: A | Discharge status: Home / Self Care

## 2023-05-19 DIAGNOSIS — K047 Periapical abscess without sinus: Secondary | ICD-10-CM

## 2023-05-19 DIAGNOSIS — K0889 Other specified disorders of teeth and supporting structures: Secondary | ICD-10-CM | POA: Diagnosis not present

## 2023-05-19 MED ORDER — AMOXICILLIN-POT CLAVULANATE 875-125 MG PO TABS: 1.0000 | ORAL_TABLET | Freq: Two times a day (BID) | ORAL | 0 refills | Status: DC

## 2023-05-19 NOTE — Discharge Instructions (Signed)
 Antibiotic prescribed for dental infection.  Please follow-up with dentist.

## 2023-05-19 NOTE — ED Triage Notes (Signed)
 Pt presents with dental pain for 1 day. Pain is on right lower side. Pt says right side of her face is in pain as well her right ear.

## 2023-05-19 NOTE — ED Provider Notes (Signed)
 [name]    CSN: [id number] Arrival date & time: 05/19/23  1222      History   Chief Complaint Chief Complaint  Patient presents with   Dental Pain    HPI Michelle Yang is a 58 y.o. female.   Patient presents with right sided dental pain that started last night.  She has taken aspirin for pain.  Has not yet followed up with dentist.   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 Authorizing Provider  amLODipine (NORVASC) 5 MG tablet Take 1 tablet by mouth daily. 05/01/23 04/30/24 Yes [provider]  amoxicillin-clavulanate (AUGMENTIN) 875-125 MG tablet Take 1 tablet by mouth every 12 (twelve) hours. 05/19/23  Yes Delmar Arriaga, Rolly Salter E, FNP  azithromycin (ZITHROMAX) 250 MG tablet Take by mouth. 05/01/23  Yes [provider]  Cholecalciferol 1.25 MG (50000 UT) capsule Take by mouth. 07/19/21  Yes [provider]  lisinopril (ZESTRIL) 10 MG tablet Take 1 tablet by mouth daily. 12/29/21  Yes [provider]  predniSONE (DELTASONE) 10 MG tablet Take 6 tablets PO day 1, 5 tablets day 2, 4 tablets day 3, 3 tablets day  4, 2 tablets day 5, 1 tablet day 6 05/01/23  Yes [provider]  WEGOVY 0.25 MG/0.5ML SOAJ Inject into the skin. 05/09/23  Yes [provider]  albuterol (VENTOLIN HFA) 108 (90 Base) MCG/ACT inhaler Inhale 1-2 puffs into the lungs every 6 (six) hours as needed for wheezing or shortness of breath. 12/11/22   Gustavus Bryant, FNP  atorvastatin (LIPITOR) 20 MG tablet Take 20 mg by mouth daily. 11/28/21   [provider]  hydrochlorothiazide (MICROZIDE) 12.5 MG capsule Take 12.5 mg by mouth daily. 09/05/21   [provider]  ibuprofen (ADVIL) 600 MG tablet Take 1 tablet (600 mg total) by mouth every 6 (six) hours as needed. 02/05/22   Tomi Bamberger, PA-C  losartan (COZAAR) 100 MG tablet Take 100 mg by mouth daily. 11/14/21   [provider]  triamcinolone (KENALOG) 0.025 % cream Apply 1 application topically 2 (two) times daily as needed (Apply to affected areas twice daily as needed). 07/03/20   Bing Neighbors, NP    Family History Family History  Problem Relation Age of Onset   Diabetes Father     Social History Social History  Tobacco Use   Smoking status: Never   Smokeless tobacco: Never  Substance Use Topics   Alcohol use: No   Drug use: No     Allergies   Patient has no known allergies.   Review of Systems Review of Systems Per HPI  Physical Exam Triage Vital Signs ED Triage Vitals  Encounter Vitals Group     BP 05/19/23 1243 (!) 153/100     Systolic BP Percentile --      Diastolic BP Percentile --      Pulse Rate 05/19/23 1243 94     Resp 05/19/23 1243 18     Temp 05/19/23 1243 98.2 F (36.8 C)     Temp Source 05/19/23 1243 Oral     SpO2 05/19/23 1243 97 %     Weight --      Height --      Head Circumference --      Peak Flow --      Pain Score 05/19/23 1241 8     Pain Loc --      Pain Education --      Exclude from Growth Chart --    No data found.  Updated Vital Signs BP (!) 153/100 (BP Location: Left  Arm)   Pulse 94   Temp 98.2 F (36.8 C) (Oral)   Resp 18   LMP 01/13/2017 Comment: saturday  SpO2 97%   Visual Acuity Right Eye Distance:   Left Eye Distance:   Bilateral Distance:    Right Eye Near:   Left Eye Near:    Bilateral Near:     Physical Exam Constitutional:      General: She is not in acute distress.    Appearance: Normal appearance. She is not toxic-appearing or diaphoretic.  HENT:     Head: Normocephalic and atraumatic.     Mouth/Throat:      Comments: Patient is missing teeth to right upper dentition.  There is surrounding gingival swelling and erythema.  Swelling seems to extend slightly into the inner cheek. Eyes:     Extraocular Movements: Extraocular movements intact.     Conjunctiva/sclera: Conjunctivae normal.  Pulmonary:     Effort: Pulmonary effort is normal.  Neurological:     General: No focal deficit present.     Mental Status: She is alert and oriented to person, place, and time. Mental status is at baseline.  Psychiatric:        Mood and Affect: Mood normal.        Behavior: Behavior normal.        Thought Content: Thought content normal.        Judgment: Judgment normal.      UC Treatments / Results  Labs (all labs ordered are listed, but only abnormal results are displayed) Labs Reviewed - No data to display  EKG   Radiology No results found.  Procedures Procedures (including critical care time)  Medications Ordered in UC Medications - No data to display  Initial Impression / Assessment and Plan / UC Course  I have reviewed the triage vital signs and the nursing notes.  Pertinent labs & imaging results that were available during my care of the patient were reviewed by me and considered in my medical decision making (see chart for details).     Patient has dental infection.  Will treat with Augmentin.  Patient reports that she did not take azithromycin that was recently prescribed for another reason so this should be  safe.  Advised strict follow-up with dentist as well.  Patient verbalized understanding and was agreeable with plan. Final Clinical Impressions(s) / UC Diagnoses   Final diagnoses:  Dental infection  Pain, dental     Discharge Instructions      Antibiotic prescribed for dental infection.  Please follow-up with dentist.     ED Prescriptions     Medication Sig Dispense Auth. Provider   amoxicillin-clavulanate (AUGMENTIN) 875-125 MG tablet Take 1 tablet by mouth every 12 (twelve) hours. 14 tablet [place], Acie Fredricks[place], [place]      PDMP not reviewed this encounter.   Gustavus Bryant, [place] 05/19/23 1321

## 2023-06-14 DIAGNOSIS — Z8781 Personal history of (healed) traumatic fracture: Secondary | ICD-10-CM | POA: Insufficient documentation

## 2023-06-14 DIAGNOSIS — L309 Dermatitis, unspecified: Secondary | ICD-10-CM | POA: Insufficient documentation

## 2023-06-14 DIAGNOSIS — Z6834 Body mass index (BMI) 34.0-34.9, adult: Secondary | ICD-10-CM | POA: Insufficient documentation

## 2023-07-23 ENCOUNTER — Ambulatory Visit: Payer: Self-pay | Admitting: Physician Assistant

## 2023-07-23 ENCOUNTER — Encounter: Payer: Self-pay | Admitting: Emergency Medicine

## 2023-07-23 ENCOUNTER — Ambulatory Visit: Admission: EM | Admit: 2023-07-23 | Discharge: 2023-07-23 | Disposition: A | Discharge status: Home / Self Care

## 2023-07-23 ENCOUNTER — Ambulatory Visit (INDEPENDENT_AMBULATORY_CARE_PROVIDER_SITE_OTHER)

## 2023-07-23 DIAGNOSIS — S99922A Unspecified injury of left foot, initial encounter: Secondary | ICD-10-CM

## 2023-07-23 DIAGNOSIS — M79675 Pain in left toe(s): Secondary | ICD-10-CM

## 2023-07-23 MED ORDER — IBUPROFEN 600 MG PO TABS: 600.0000 mg | ORAL_TABLET | Freq: Three times a day (TID) | ORAL | 0 refills | Status: AC | PRN

## 2023-07-23 NOTE — ED Triage Notes (Signed)
 Pt presents c/o left pinky toe pain. Pt said she smashed it in the door at work and wants to be sure it isn't broken.

## 2023-07-23 NOTE — ED Provider Notes (Signed)
 [name]    CSN: [id number] Arrival date & time: 07/23/23  1416      History   Chief Complaint Chief Complaint  Patient presents with   Toe Injury    HPI Michelle Yang is a 58 y.o. female.   Patient presents today with a several day history of left fifth toe pain.  She reports that she was at work when she slammed her toe into a door.  Initially she had some pain but this has gradually worsened and is currently rated 7/8 on a 0-10 pain scale, localized to her left fifth toe, described as throbbing, worse with ambulation, no relieving factors identified.  She has not been taking any over-the-counter medication for symptom management.  She is able to ambulate unassisted.  Does report that she has previously injured her ankle on the left that required surgical intervention but has not had any surgery or injury involving her foot/toes.    Past Medical History:  Diagnosis Date   Hypertension    Medical history non-contributory    Stroke [facility])     Patient Active Problem List   Diagnosis Date Noted   BMI 34.0-34.9,adult 06/14/2023   Eczema 06/14/2023   History of ankle fracture 06/14/2023   Essential hypertension 08/19/2021   Mixed hyperlipidemia 08/19/2021   Vitamin D deficiency 08/19/2021   Rectocele 06/30/2021   TIA (transient ischemic attack) 11/17/2019   Weakness of right side of body 11/17/2019   Elevated blood pressure reading without diagnosis of hypertension 11/17/2019   Abnormal serum creatinine level 11/17/2019   Elevated glucose 11/17/2019   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 Authorizing Provider  ibuprofen  (ADVIL ) 600 MG tablet Take 1 tablet (600 mg total) by mouth every 8 (eight) hours as needed. 07/23/23  Yes Laird Runnion K, PA-C  phentermine 15 MG capsule Take 15 mg by mouth daily. 06/14/23  Yes [provider]  phentermine 30 MG capsule Take 1 capsule by mouth daily. 07/17/23  Yes [provider]  albuterol  (VENTOLIN  HFA) 108 (90 Base) MCG/ACT inhaler Inhale 1-2 puffs into the lungs every 6 (six) hours as needed for wheezing or shortness of breath. 12/11/22   Dodson Freestone, FNP  amLODipine  (NORVASC ) 5 MG tablet Take 1 tablet by mouth daily. 05/01/23 04/30/24  [provider]  atorvastatin  (LIPITOR ) 20 MG tablet Take 20 mg by mouth daily. 11/28/21   [provider]  Cholecalciferol 1.25 MG (50000 UT) capsule Take by mouth. 07/19/21   [provider]  hydrochlorothiazide (MICROZIDE) 12.5 MG capsule Take 12.5 mg by mouth daily. 09/05/21   [provider]  lisinopril (ZESTRIL) 10 MG tablet Take 1 tablet by mouth daily. 12/29/21   [provider]  losartan (COZAAR) 100 MG tablet Take 100 mg by mouth  daily. 11/14/21   [provider]  triamcinolone  (KENALOG ) 0.025 % cream Apply 1 application topically 2 (two) times daily as needed (Apply to affected areas twice daily as needed). 07/03/20   Buena Carmine, NP  WEGOVY 0.25 MG/0.5ML SOAJ Inject into the skin. 05/09/23   [provider]    Family History Family History  Problem Relation Age of Onset   Diabetes Father     Social History Social History   Tobacco Use   Smoking status: Never    Passive exposure: Never   Smokeless tobacco: Never  Substance Use Topics   Alcohol use: No   Drug use: No     Allergies   Patient has no known allergies.   Review of Systems Review of Systems  Constitutional:  Positive for activity change. Negative for appetite change, fatigue and fever.   Musculoskeletal:  Positive for arthralgias and gait problem. Negative for joint swelling and myalgias.  Skin:  Negative for color change and wound.  Neurological:  Negative for weakness and numbness.     Physical Exam Triage Vital Signs ED Triage Vitals  Encounter Vitals Group     BP 07/23/23 1532 131/88     Systolic BP Percentile --      Diastolic BP Percentile --      Pulse Rate 07/23/23 1532 92     Resp 07/23/23 1532 18     Temp 07/23/23 1532 98 F (36.7 C)     Temp Source 07/23/23 1532 Oral     SpO2 07/23/23 1532 97 %     Weight 07/23/23 1532 199 lb 15.3 oz (90.7 kg)     Height --      Head Circumference --      Peak Flow --      Pain Score 07/23/23 1530 8     Pain Loc --      Pain Education --      Exclude from Growth Chart --    No data found.  Updated Vital Signs BP 131/88 (BP Location: Left Arm)   Pulse 92   Temp 98 F (36.7 C) (Oral)   Resp 18   Wt 199 lb 15.3 oz (90.7 kg)   LMP 01/13/2017 Comment: saturday  SpO2 97%   BMI 34.32 kg/m   Visual Acuity Right Eye Distance:   Left Eye Distance:   Bilateral Distance:    Right Eye Near:   Left Eye Near:    Bilateral Near:     Physical Exam Vitals reviewed.  Constitutional:      General: She is awake. She is not in acute distress.    Appearance: Normal appearance. She is well-developed. She is not ill-appearing.     Comments: Very pleasant female appears stated age in no acute distress sitting comfortably in exam room  HENT:     Head: Normocephalic and atraumatic.  Cardiovascular:     Rate and Rhythm: Normal rate and regular rhythm.     Heart sounds: Normal heart sounds, S1 normal and S2 normal. No murmur heard.    Comments: Capillary fill within 2 seconds left toes Pulmonary:     Effort: Pulmonary effort is normal.     Breath sounds: Normal breath sounds. No wheezing, rhonchi or rales.     Comments: Clear to auscultation bilaterally Musculoskeletal:     Left foot: Normal range of motion. No  deformity.  Feet:     Left foot:     Protective Sensation: 10 sites tested.  10  sites sensed.     Skin integrity: Skin integrity normal.     Toenail Condition: Left toenails are normal.     Comments: Left foot: Tender to palpation over left fifth toe.  No deformity noted.  No tenderness over any of the other toes or along the foot.  Foot is neurovascularly intact.  No wounds or bruising noted. Psychiatric:        Behavior: Behavior is cooperative.      UC Treatments / Results  Labs (all labs ordered are listed, but only abnormal results are displayed) Labs Reviewed - No data to display  EKG   Radiology No results found.  Procedures Procedures (including critical care time)  Medications Ordered in UC Medications - No data to display  Initial Impression / Assessment and Plan / UC Course  I have reviewed the triage vital signs and the nursing notes.  Pertinent labs & imaging results that were available during my care of the patient were reviewed by me and considered in my medical decision making (see chart for details).     Patient is well-appearing, afebrile, nontoxic, nontachycardic.  X-ray was obtained given mechanism of injury that showed no acute osseous abnormality based on my primary read.  At the time of discharge we were waiting for radiologist overread and we will contact her if this differs and changes her treatment plan.  Given discomfort she was placed in a buddy tape to provide support and comfort.  Will start ibuprofen  600 mg and we discussed that she is not to take additional NSAIDs with this medication due to risk of GI bleeding.  If her symptoms not improving quickly she is to follow-up with foot ankle specialist and she was given their contact information with instruction to call to schedule appointment as needed.  Recommended elevation and avoiding strenuous activity including prolonged ambulation.  Discussed that if anything worsens or changes she should return  for reevaluation.  Strict return precautions given.  Excuse note provided.  Final Clinical Impressions(s) / UC Diagnoses   Final diagnoses:  Injury of toe on left foot, initial encounter  Toe pain, left     Discharge Instructions      I did not see anything that was broken on your x-ray.  I will contact you if the radiologist sees something and changes her treatment plan.  Keep it elevated is much as possible.  Take ibuprofen  for pain relief.  Do not take additional NSAIDs with this medication including aspirin , ibuprofen /Advil , naproxen/Aleve, diclofenac/Voltaren as this can cause stomach bleeding.  You can use acetaminophen /Tylenol  for breakthrough pain.  If your symptoms are improving quickly please follow-up with [facility] for further evaluation and management.  If anything worsens and you have increasing pain, swelling, discoloration, numbness or tingling you should be seen immediately.   ED Prescriptions     Medication Sig Dispense Auth. Provider   ibuprofen  (ADVIL ) 600 MG tablet Take 1 tablet (600 mg total) by mouth every 8 (eight) hours as needed. 30 tablet Mance Vallejo K, PA-C      PDMP not reviewed this encounter.   Budd Cargo, PA-C 07/23/23 1635

## 2023-07-23 NOTE — Discharge Instructions (Signed)
 I did not see anything that was broken on your x-ray.  I will contact you if the radiologist sees something and changes her treatment plan.  Keep it elevated is much as possible.  Take ibuprofen  for pain relief.  Do not take additional NSAIDs with this medication including aspirin , ibuprofen /Advil , naproxen/Aleve, diclofenac/Voltaren as this can cause stomach bleeding.  You can use acetaminophen /Tylenol  for breakthrough pain.  If your symptoms are improving quickly please follow-up with [facility] for further evaluation and management.  If anything worsens and you have increasing pain, swelling, discoloration, numbness or tingling you should be seen immediately.

## 2023-12-12 ENCOUNTER — Ambulatory Visit
Admission: RE | Admit: 2023-12-12 | Discharge: 2023-12-12 | Disposition: A | Discharge status: Home / Self Care | Source: Ambulatory Visit | Attending: Family Medicine | Admitting: Family Medicine

## 2023-12-12 VITALS — BP 112/78 | HR 97 | Temp 97.6°F | Resp 18 | Ht 64.0 in | Wt 182.0 lb

## 2023-12-12 DIAGNOSIS — L03011 Cellulitis of right finger: Secondary | ICD-10-CM

## 2023-12-12 MED ORDER — MUPIROCIN CALCIUM 2 % EX CREA: 1.0000 | TOPICAL_CREAM | Freq: Two times a day (BID) | CUTANEOUS | 0 refills | Status: AC

## 2023-12-12 NOTE — Discharge Instructions (Addendum)
-   Use mupirocin ointment twice daily for 7 days -Continue to soak the finger in warm water for 10 to 15 minutes 3-4 times a day

## 2023-12-12 NOTE — ED Provider Notes (Signed)
 [name]    CSN: [id number] Arrival date & time: 12/12/23  1031      History   Chief Complaint Chief Complaint  Patient presents with   Hand Pain    HPI Michelle Yang is a 58 y.o. female.    Hand Pain   Started 1 week ago, pain and swelling of cuticle of right middle finger.  She has been soaking in warm water without relief.  No other medications.  No involvement of any other fingers, no systemic symptoms.  Past Medical History:  Diagnosis Date   Hypertension    Stroke [facility])     Patient Active Problem List   Diagnosis Date Noted   BMI 34.0-34.9,adult 06/14/2023   Eczema 06/14/2023   History of ankle fracture 06/14/2023   Essential hypertension 08/19/2021   Mixed hyperlipidemia 08/19/2021   Vitamin D deficiency 08/19/2021   Rectocele 06/30/2021   Uterovaginal prolapse 06/30/2021   Midline cystocele 06/30/2021   TIA (transient ischemic attack) 11/17/2019   Weakness of right side of body 11/17/2019   Elevated blood pressure reading without diagnosis of hypertension 11/17/2019   Abnormal serum creatinine level 11/17/2019   Elevated glucose 11/17/2019   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 Authorizing Provider  hydrochlorothiazide (HYDRODIURIL) 25 MG tablet Take 1 tablet by mouth daily. 12/12/21  Yes [provider]  ibuprofen  (ADVIL ) 800 MG tablet Take 800 mg by mouth every 8 (eight) hours as needed. 10/08/23  Yes [provider]  methylPREDNISolone (MEDROL DOSEPAK) 4 MG TBPK tablet Take 4 mg by mouth as directed.  11/16/23  Yes [provider]  mupirocin cream (BACTROBAN) 2 % Apply 1 Application topically 2 (two) times daily for 7 days. 12/12/23 12/19/23 Yes Howell Lunger, DO  phentermine 37.5 MG capsule Take 37.5 mg by mouth every morning. 12/04/23 03/03/24 Yes [provider]  triamcinolone  cream (KENALOG ) 0.1 % Apply 1 Application topically 2 (two) times daily. 06/20/21  Yes [provider]  albuterol  (VENTOLIN  HFA) 108 (90 Base) MCG/ACT inhaler Inhale 1-2 puffs into the lungs every 6 (six) hours as needed for wheezing or shortness of breath. 12/11/22   Hazen Darryle BRAVO, FNP  amLODipine  (NORVASC ) 10 MG tablet Take 10 mg by mouth daily.    [provider]  amLODipine  (NORVASC ) 5 MG tablet Take 1 tablet by mouth daily. 05/01/23 04/30/24  [provider]  atorvastatin  (LIPITOR ) 20 MG tablet Take 20 mg by mouth daily. 11/28/21   [provider]  Cholecalciferol 1.25 MG (50000 UT) capsule Take by mouth. 07/19/21   [provider]  hydrochlorothiazide (MICROZIDE) 12.5 MG capsule Take 12.5 mg by mouth daily. 09/05/21   [provider]  ibuprofen  (ADVIL ) 600 MG tablet Take 1 tablet (600 mg total) by mouth every 8 (eight) hours  as needed. 07/23/23   Raspet, Erin K, PA-C  lisinopril (ZESTRIL) 10 MG tablet Take 1 tablet by mouth daily. 12/29/21   [provider]  losartan (COZAAR) 100 MG tablet Take 100 mg by mouth daily. 11/14/21   [provider]  phentermine 15 MG capsule Take 15 mg by mouth daily. 06/14/23   [provider]  phentermine 30 MG capsule Take 1 capsule by mouth daily. 07/17/23   [provider]  triamcinolone  (KENALOG ) 0.025 % cream Apply 1 application topically 2 (two) times daily as needed (Apply to affected areas twice daily as needed). 07/03/20   Arloa Suzen RAMAN, NP  WEGOVY 0.25 MG/0.5ML SOAJ Inject into the skin. 05/09/23   [provider]    Family History Family History  Problem Relation  Age of Onset   Diabetes Father     Social History Social History   Tobacco Use   Smoking status: Never    Passive exposure: Never   Smokeless tobacco: Never  Vaping Use   Vaping status: Never Used  Substance Use Topics   Alcohol use: No   Drug use: No     Allergies   Patient has no known allergies.   Review of Systems Review of Systems   Physical Exam Triage Vital Signs ED Triage Vitals  Encounter Vitals Group     BP --      Girls Systolic BP Percentile --      Girls Diastolic BP Percentile --      Boys Systolic BP Percentile --      Boys Diastolic BP Percentile --      Pulse --      Resp --      Temp --      Temp src --      SpO2 --      Weight 12/12/23 1043 182 lb (82.6 kg)     Height 12/12/23 1043 5' 4 (1.626 m)     Head Circumference --      Peak Flow --      Pain Score 12/12/23 1042 4     Pain Loc --      Pain Education --      Exclude from Growth Chart --    No data found.  Updated Vital Signs BP 112/78 (BP Location: Left Arm)   Pulse 97   Temp 97.6 F (36.4 C) (Oral)   Resp 18   Ht 5' 4 (1.626 m)   Wt 182 lb (82.6 kg)   LMP 01/13/2017 Comment: saturday  SpO2 99%   BMI 31.24 kg/m   Visual Acuity Right Eye Distance:   Left Eye Distance:   Bilateral Distance:    Right Eye Near:   Left Eye Near:    Bilateral Near:     Physical Exam Constitutional:      General: She is not in acute distress.    Appearance: Normal appearance.  Cardiovascular:     Rate and Rhythm: Normal rate and regular rhythm.     Heart sounds: Normal heart sounds.  Pulmonary:     Effort: Pulmonary effort is normal.     Breath sounds: Normal breath sounds.  Musculoskeletal:     Comments: Full range of motion of all fingers of right hand.  5/5 strength and normal sensation.  Skin:    General: Skin is warm and dry.     Comments: Mild swelling and tenderness over cuticle of right middle finger.  No drainage, no fluctuance.  Neurological:  Mental Status: She  is alert.      UC Treatments / Results  Labs (all labs ordered are listed, but only abnormal results are displayed) Labs Reviewed - No data to display  EKG   Radiology No results found.  Procedures Procedures (including critical care time)  Medications Ordered in UC Medications - No data to display  Initial Impression / Assessment and Plan / UC Course  I have reviewed the triage vital signs and the nursing notes.  Pertinent labs & imaging results that were available during my care of the patient were reviewed by me and considered in my medical decision making (see chart for details).     Afebrile, well-appearing 58 year old female with evidence of paronychia without abscess.  Trial mupirocin ointment twice daily for 7 days, advised to soak finger in warm water 3-4 times daily.  Follow-up and return precautions discussed.   Final Clinical Impressions(s) / UC Diagnoses   Final diagnoses:  Paronychia of finger, right     Discharge Instructions      - Use mupirocin ointment twice daily for 7 days -Continue to soak the finger in warm water for 10 to 15 minutes 3-4 times a day     ED Prescriptions     Medication Sig Dispense Auth. Provider   mupirocin cream (BACTROBAN) 2 % Apply 1 Application topically 2 (two) times daily for 7 days. 15 g Howell Lunger, DO      PDMP not reviewed this encounter.   Howell Lunger, [place] 12/12/23 1127

## 2023-12-12 NOTE — ED Triage Notes (Signed)
 Patient reports right middle finger hurting, throbbing, swelling the last few days. Seems to be just end of finger around nail. No fever. No injury.
